# Patient Record
Sex: Male | Born: 1965 | Race: Black or African American | Hispanic: No | Smoking: Current every day smoker
Health system: Southern US, Community
[De-identification: ages and names within clinical notes are randomized; demographics above are authoritative.]

## PROBLEM LIST (undated history)

## (undated) DIAGNOSIS — IMO0001 Reserved for inherently not codable concepts without codable children: Secondary | ICD-10-CM

## (undated) DIAGNOSIS — K219 Gastro-esophageal reflux disease without esophagitis: Secondary | ICD-10-CM

## (undated) HISTORY — PX: HERNIA REPAIR: SHX51

## (undated) HISTORY — DX: Gastro-esophageal reflux disease without esophagitis: K21.9

---

## 2006-11-09 ENCOUNTER — Emergency Department (HOSPITAL_COMMUNITY): Admission: EM | Admit: 2006-11-09 | Discharge: 2006-11-09 | Payer: Self-pay | Admitting: Family Medicine

## 2011-08-12 ENCOUNTER — Emergency Department (HOSPITAL_COMMUNITY)
Admission: EM | Admit: 2011-08-12 | Discharge: 2011-08-12 | Disposition: A | Discharge status: Home / Self Care | Payer: Self-pay | Attending: Emergency Medicine | Admitting: Emergency Medicine

## 2011-08-12 ENCOUNTER — Encounter: Payer: Self-pay | Admitting: *Deleted

## 2011-08-12 ENCOUNTER — Emergency Department (HOSPITAL_COMMUNITY): Payer: Self-pay

## 2011-08-12 DIAGNOSIS — R0602 Shortness of breath: Secondary | ICD-10-CM | POA: Insufficient documentation

## 2011-08-12 DIAGNOSIS — R059 Cough, unspecified: Secondary | ICD-10-CM | POA: Insufficient documentation

## 2011-08-12 DIAGNOSIS — R05 Cough: Secondary | ICD-10-CM | POA: Insufficient documentation

## 2011-08-12 DIAGNOSIS — J45901 Unspecified asthma with (acute) exacerbation: Secondary | ICD-10-CM | POA: Insufficient documentation

## 2011-08-12 MED ORDER — PREDNISONE 20 MG PO TABS
60.0000 mg | ORAL_TABLET | Freq: Once | ORAL | Status: AC
  Administered 2011-08-12: 60 mg via ORAL
  Filled 2011-08-12: qty 3

## 2011-08-12 MED ORDER — ALBUTEROL SULFATE (5 MG/ML) 0.5% IN NEBU
5.0000 mg | INHALATION_SOLUTION | Freq: Once | RESPIRATORY_TRACT | Status: AC
  Administered 2011-08-12: 5 mg via RESPIRATORY_TRACT
  Filled 2011-08-12: qty 1

## 2011-08-12 MED ORDER — PREDNISONE 10 MG PO TABS: 20.0000 mg | ORAL_TABLET | Freq: Two times a day (BID) | ORAL | Status: AC

## 2011-08-12 MED ORDER — ALBUTEROL SULFATE (5 MG/ML) 0.5% IN NEBU
INHALATION_SOLUTION | RESPIRATORY_TRACT | Status: AC
  Filled 2011-08-12: qty 2

## 2011-08-12 MED ORDER — ALBUTEROL (5 MG/ML) CONTINUOUS INHALATION SOLN
10.0000 mg/h | INHALATION_SOLUTION | Freq: Once | RESPIRATORY_TRACT | Status: AC
  Administered 2011-08-12: 10 mg/h via RESPIRATORY_TRACT

## 2011-08-12 MED ORDER — AEROCHAMBER PLUS W/MASK MISC
Status: AC
  Administered 2011-08-12: 15:00:00
  Filled 2011-08-12: qty 1

## 2011-08-12 MED ORDER — IPRATROPIUM BROMIDE 0.02 % IN SOLN
0.5000 mg | Freq: Once | RESPIRATORY_TRACT | Status: AC
  Administered 2011-08-12: 0.5 mg via RESPIRATORY_TRACT
  Filled 2011-08-12: qty 2.5

## 2011-08-12 MED ORDER — ALBUTEROL SULFATE HFA 108 (90 BASE) MCG/ACT IN AERS
2.0000 | INHALATION_SPRAY | RESPIRATORY_TRACT | Status: AC
  Administered 2011-08-12: 2 via RESPIRATORY_TRACT
  Filled 2011-08-12: qty 6.7

## 2011-08-12 NOTE — ED Notes (Signed)
Pt eating at present and RT will send next shift RT down to give hr long neb tx after pt finishes eating.

## 2011-08-12 NOTE — ED Notes (Signed)
Pt NAD, resp e/u, pt states understanding of discharge instructions and denies questions at time of discharge. Pt ambulatory with steady gait, AOx4 at time of discharge.

## 2011-08-12 NOTE — Discharge Instructions (Signed)
Use albuterol inhaler, 2 puffs every 4 hours, as needed for cough and shortness of breath.  Take prednisone as prescribed.  Call [facility] [phone]) if you do not have a primary care doctor and would like assistance with finding one.   You should return to the ER if your shortness of breath worsens. Asthma Attack Prevention HOW CAN ASTHMA BE PREVENTED? Currently, there is no way to prevent asthma from starting. However, you can take steps to control the disease and prevent its symptoms after you have been diagnosed. Learn about your asthma and how to control it. Take an active role to control your asthma by working with your caregiver to create and follow an asthma action plan. An asthma action plan guides you in taking your medicines properly, avoiding factors that make your asthma worse, tracking your level of asthma control, responding to worsening asthma, and seeking emergency care when needed. To track your asthma, keep records of your symptoms, check your peak flow number using a peak flow meter (handheld device that shows how well air moves out of your lungs), and get regular asthma checkups.  Other ways to prevent asthma attacks include:  Use medicines as your caregiver directs.   Identify and avoid things that make your asthma worse (as much as you can).   Keep track of your asthma symptoms and level of control.   Get regular checkups for your asthma.   With your caregiver, write a detailed plan for taking medicines and managing an asthma attack. Then be sure to follow your action plan. Asthma is an ongoing condition that needs regular monitoring and treatment.   Identify and avoid asthma triggers. A number of outdoor allergens and irritants (pollen, mold, cold air, air pollution) can trigger asthma attacks. Find out what causes or makes your asthma worse, and take steps to avoid those triggers (see below).   Monitor your breathing. Learn to recognize warning signs of an attack, such  as slight coughing, wheezing or shortness of breath. However, your lung function may already decrease before you notice any signs or symptoms, so regularly measure and record your peak airflow with a home peak flow meter.   Identify and treat attacks early. If you act quickly, you're less likely to have a severe attack. You will also need less medicine to control your symptoms. When your peak flow measurements decrease and alert you to an upcoming attack, take your medicine as instructed, and immediately stop any activity that may have triggered the attack. If your symptoms do not improve, get medical help.   Pay attention to increasing quick-relief inhaler use. If you find yourself relying on your quick-relief inhaler (such as albuterol), your asthma is not under control. See your caregiver about adjusting your treatment.  IDENTIFY AND CONTROL FACTORS THAT MAKE YOUR ASTHMA WORSE A number of common things can set off or make your asthma symptoms worse (asthma triggers). Keep track of your asthma symptoms for several weeks, detailing all the environmental and emotional factors that are linked with your asthma. When you have an asthma attack, go back to your asthma diary to see which factor, or combination of factors, might have contributed to it. Once you know what these factors are, you can take steps to control many of them.  Allergies: If you have allergies and asthma, it is important to take asthma prevention steps at home. Asthma attacks (worsening of asthma symptoms) can be triggered by allergies, which can cause temporary increased inflammation of your airways.  Minimizing contact with the substance to which you are allergic will help prevent an asthma attack. Animal Dander:   Some people are allergic to the flakes of skin or dried saliva from animals with fur or feathers. Keep these pets out of your home.   If you can't keep a pet outdoors, keep the pet out of your bedroom and other sleeping areas  at all times, and keep the door closed.   Remove carpets and furniture covered with cloth from your home. If that is not possible, keep the pet away from fabric-covered furniture and carpets.  Dust Mites:  Many people with asthma are allergic to dust mites. Dust mites are tiny bugs that are found in every home, in mattresses, pillows, carpets, fabric-covered furniture, bedcovers, clothes, stuffed toys, fabric, and other fabric-covered items.   Cover your mattress in a special dust-proof cover.   Cover your pillow in a special dust-proof cover, or wash the pillow each week in hot water. Water must be hotter than 130 F to kill dust mites. Cold or warm water used with detergent and bleach can also be effective.   Wash the sheets and blankets on your bed each week in hot water.   Try not to sleep or lie on cloth-covered cushions.   Call ahead when traveling and ask for a smoke-free hotel room. Bring your own bedding and pillows, in case the hotel only supplies feather pillows and down comforters, which may contain dust mites and cause asthma symptoms.   Remove carpets from your bedroom and those laid on concrete, if you can.   Keep stuffed toys out of the bed, or wash the toys weekly in hot water or cooler water with detergent and bleach.  Cockroaches:  Many people with asthma are allergic to the droppings and remains of cockroaches.   Keep food and garbage in closed containers. Never leave food out.   Use poison baits, traps, powders, gels, or paste (for example, boric acid).   If a spray is used to kill cockroaches, stay out of the room until the odor goes away.  Indoor Mold:  Fix leaky faucets, pipes, or other sources of water that have mold around them.   Clean moldy surfaces with a cleaner that has bleach in it.  Pollen and Outdoor Mold:  When pollen or mold spore counts are high, try to keep your windows closed.   Stay indoors with windows closed from late morning to  afternoon, if you can. Pollen and some mold spore counts are highest at that time.   Ask your caregiver whether you need to take or increase anti-inflammatory medicine before your allergy season starts.  Irritants:   Tobacco smoke is an irritant. If you smoke, ask your caregiver how you can quit. Ask family members to quit smoking, too. Do not allow smoking in your home or car.   If possible, do not use a wood-burning stove, kerosene heater, or fireplace. Minimize exposure to all sources of smoke, including incense, candles, fires, and fireworks.   Try to stay away from strong odors and sprays, such as perfume, talcum powder, hair spray, and paints.   Decrease humidity in your home and use an indoor air cleaning device. Reduce indoor humidity to below 60 percent. Dehumidifiers or central air conditioners can do this.   Try to have someone else vacuum for you once or twice a week, if you can. Stay out of rooms while they are being vacuumed and for a short while afterward.  If you vacuum, use a dust mask from a hardware store, a double-layered or microfilter vacuum cleaner bag, or a vacuum cleaner with a HEPA filter.   Sulfites in foods and beverages can be irritants. Do not drink beer or wine, or eat dried fruit, processed potatoes, or shrimp if they cause asthma symptoms.   Cold air can trigger an asthma attack. Cover your nose and mouth with a scarf on cold or windy days.   Several health conditions can make asthma more difficult to manage, including runny nose, sinus infections, reflux disease, psychological stress, and sleep apnea. Your caregiver will treat these conditions, as well.   Avoid close contact with people who have a cold or the flu, since your asthma symptoms may get worse if you catch the infection from them. Wash your hands thoroughly after touching items that may have been handled by people with a respiratory infection.   Get a flu shot every year to protect against the  flu virus, which often makes asthma worse for days or weeks. Also get a pneumonia shot once every five to 10 years.  Drugs:  Aspirin and other painkillers can cause asthma attacks. 10% to 20% of people with asthma have sensitivity to aspirin or a group of painkillers called non-steroidal anti-inflammatory drugs (NSAIDS), such as ibuprofen and naproxen. These drugs are used to treat pain and reduce fevers. Asthma attacks caused by any of these medicines can be severe and even fatal. These drugs must be avoided in people who have known aspirin sensitive asthma. Products with acetaminophen are considered safe for people who have asthma. It is important that people with aspirin sensitivity read labels of all over-the-counter drugs used to treat pain, colds, coughs, and fever.   Beta blockers and ACE inhibitors are other drugs which you should discuss with your caregiver, in relation to your asthma.  ALLERGY SKIN TESTING  Ask your asthma caregiver about allergy skin testing or blood testing (RAST test) to identify the allergens to which you are sensitive. If you are found to have allergies, allergy shots (immunotherapy) for asthma may help prevent future allergies and asthma. With allergy shots, small doses of allergens (substances to which you are allergic) are injected under your skin on a regular schedule. Over a period of time, your body may become used to the allergen and less responsive with asthma symptoms. You can also take measures to minimize your exposure to those allergens. EXERCISE  If you have exercise-induced asthma, or are planning vigorous exercise, or exercise in cold, humid, or dry environments, prevent exercise-induced asthma by following your caregiver's advice regarding asthma treatment before exercising. Document Released: 08/23/2009 Document Revised: 05/17/2011 Document Reviewed: 08/23/2009 [facility] Patient Information 2012 [place], [place].

## 2011-08-12 NOTE — ED Provider Notes (Signed)
History     CSN: [id number] Arrival date & time: 08/12/2011  1:23 PM      Chief Complaint  Patient presents with  . Shortness of Breath     HPI Pt was seen at 1400.  Per pt, c/o gradual onset and worsening of persistent SOB, wheezing and cough that began yesterday.  Describes his symptoms as "my asthma attack."  States he has hx of asthma, ran out of his MDI "a while ago."  Cannot recall LD prednisone.  Denies CP/palpitations, no abd pain, no N/V/D, no fevers, no rash, no back pain.    Past Medical History  Diagnosis Date  . Asthma     History reviewed. No pertinent past surgical history.   History  Substance Use Topics  . Smoking status: Former Games developer  . Smokeless tobacco: Not on file  . Alcohol Use: No    Review of Systems ROS: Statement: All systems negative except as marked or noted in the HPI; Constitutional: Negative for fever and chills. ; ; Eyes: Negative for eye pain, redness and discharge. ; ; ENMT: Negative for ear pain, hoarseness, nasal congestion, sinus pressure and sore throat. ; ; Cardiovascular: Negative for chest pain, palpitations, diaphoresis, and peripheral edema. ; ; Respiratory: +cough, wheezing, SOB; negative for stridor. ; ; Gastrointestinal: Negative for nausea, vomiting, diarrhea and abdominal pain, blood in stool, hematemesis, jaundice and rectal bleeding. . ; ; Genitourinary: Negative for dysuria, flank pain and hematuria. ; ; Musculoskeletal: Negative for back pain and neck pain. Negative for swelling and trauma.; ; Skin: Negative for pruritus, rash, abrasions, blisters, bruising and skin lesion.; ; Neuro: Negative for headache, lightheadedness and neck stiffness. Negative for weakness, altered level of consciousness , altered mental status, extremity weakness, paresthesias, involuntary movement, seizure and syncope.     Allergies  Review of patient's allergies indicates no known allergies.  Home Medications   Current Outpatient Rx  Name Route  Sig Dispense Refill  . PRIMATENE ASTHMA PO Oral Take 1 tablet by mouth daily.        Pulse 96  Temp(Src) 98.1 F (36.7 C) (Oral)  Resp 14  SpO2 99%  Physical Exam 1405: Physical examination:  Nursing notes reviewed; Vital signs and O2 SAT reviewed;  Constitutional: Well developed, Well nourished, Well hydrated, In no acute distress; Head:  Normocephalic, atraumatic; Eyes: EOMI, PERRL, No scleral icterus; ENMT: Mouth and pharynx normal, Mucous membranes moist; Neck: Supple, Full range of motion, No lymphadenopathy; Cardiovascular: Regular rate and rhythm, No murmur, rub, or gallop; Respiratory: Breath sounds coarse & equal bilaterally, +insp and exp wheezes bilat, no audible wheezing.  Speaking full sentences with ease., No retrax or access mm use.  Normal respiratory effort/excursion; Chest: Nontender, Movement normal; Abdomen: Soft, Nontender, Nondistended, Normal bowel sounds; Genitourinary: No CVA tenderness; Extremities: Pulses normal, No tenderness, No edema, No calf edema or asymmetry.; Neuro: AA&Ox3, Major CN grossly intact.  No gross focal motor or sensory deficits in extremities.; Skin: Color normal, Warm, Dry, no rash.    ED Course  Procedures  1530:  States he is "starting to feel better now" after 2 neb tx and prednisone.  Lungs with exp wheezes bilat, speaking full sentences, NAD, Sats increased from 91% R/A to 99% R/A.  Will dose 3rd neb and re-eval.   MDM  MDM Reviewed: nursing note and vitals Interpretation: x-ray  CRITICAL CARE Performed by: Laray Anger Total critical care time: 35 Critical care time was exclusive of separately billable procedures and treating other patients. Critical  care was necessary to treat or prevent imminent or life-threatening deterioration. Critical care was time spent personally by me on the following activities: development of treatment plan with patient and/or surrogate as well as nursing, discussions with consultants, evaluation of  patient's response to treatment, examination of patient, obtaining history from patient or surrogate, ordering and performing treatments and interventions, ordering and review of laboratory studies, ordering and review of radiographic studies, pulse oximetry and re-evaluation of patient's condition.    Dg Chest 2 View  08/12/2011  *RADIOLOGY REPORT*  Clinical Data: 45 year old male with shortness of breath.  History of asthma.  CHEST - 2 VIEW  Comparison: None  Findings: The cardiomediastinal silhouette is unremarkable. Mild peribronchial thickening is identified. There is no evidence of focal airspace disease, pulmonary edema, pulmonary nodule/mass, pleural effusion, or pneumothorax. No acute bony abnormalities are identified.  IMPRESSION: Mild peribronchial thickening without focal pneumonia.  This is of uncertain chronicity but may represent bronchitis or changes from asthma.  Original Report Authenticated By: Rosendo Gros, M.D.     1700:  Pt states he feels "a lot better" after 3rd neb.  Lungs continue coarse with exp wheezes bilat.  +non-prod cough with exp wheezes.  No audible wheezing.  Sats 98% R/A, but drop to 91-93% R/A while talking to me.  No resp distress, no retrax or access mm use.  Speaking full sentences.  Dx testing d/w pt.  Questions answered.  Verb understanding, agreeable to CDU Obs wheezing protocol.  CDU PA Schinlever given report.       [name], DO 08/14/11 [phone]

## 2011-08-12 NOTE — ED Notes (Signed)
primatidine tablets for asthma - not helping. Non-productive cough. Upper airway congestion.

## 2011-08-12 NOTE — ED Provider Notes (Signed)
  Physical Exam  BP 121/75  Pulse 84  Temp(Src) 98.1 F (36.7 C) (Oral)  Resp 14  SpO2 95%  Physical Exam  ED Course  Procedures  MDM Pt moved to CDU on asthma protocol.  He has received an hour long continuous neb and reports that SOB and cough have improved.  O2 sat ranging from 91-97% on rm air.  Continues to have diffuse wheezing but nml effort and equal insp/exp phases.  He would like to stay a little bit longer.  Will reassess in 1 hour.  8:21 PM   O2 sat 94-96% currently.  Rest of VSS.  Pt feels well enough to go home.  Prescribed 5 day course of prednisone.  [facility]erred to [facility].  Return precautions discussed.   Otilio Miu, [place] 08/12/11 2118

## 2011-08-12 NOTE — ED Notes (Signed)
Pt given regular diet tray for dinner.

## 2011-08-14 NOTE — ED Provider Notes (Signed)
Medical screening examination/treatment/procedure(s) were performed by non-physician practitioner and as supervising physician I was immediately available for consultation/collaboration.   Santiana Glidden M Annalisse Minkoff, DO 08/14/11 0058 

## 2011-12-05 ENCOUNTER — Emergency Department (HOSPITAL_COMMUNITY)
Admission: EM | Admit: 2011-12-05 | Discharge: 2011-12-06 | Disposition: A | Discharge status: Home / Self Care | Payer: Self-pay | Attending: Emergency Medicine | Admitting: Emergency Medicine

## 2011-12-05 DIAGNOSIS — J45909 Unspecified asthma, uncomplicated: Secondary | ICD-10-CM | POA: Insufficient documentation

## 2011-12-05 DIAGNOSIS — Z87891 Personal history of nicotine dependence: Secondary | ICD-10-CM | POA: Insufficient documentation

## 2011-12-05 DIAGNOSIS — S139XXA Sprain of joints and ligaments of unspecified parts of neck, initial encounter: Secondary | ICD-10-CM | POA: Insufficient documentation

## 2011-12-05 DIAGNOSIS — S0990XA Unspecified injury of head, initial encounter: Secondary | ICD-10-CM | POA: Insufficient documentation

## 2011-12-05 DIAGNOSIS — S161XXA Strain of muscle, fascia and tendon at neck level, initial encounter: Secondary | ICD-10-CM

## 2011-12-05 DIAGNOSIS — S1093XA Contusion of unspecified part of neck, initial encounter: Secondary | ICD-10-CM | POA: Insufficient documentation

## 2011-12-05 DIAGNOSIS — S0003XA Contusion of scalp, initial encounter: Secondary | ICD-10-CM | POA: Insufficient documentation

## 2011-12-06 ENCOUNTER — Emergency Department (HOSPITAL_COMMUNITY): Payer: Self-pay

## 2011-12-06 ENCOUNTER — Encounter (HOSPITAL_COMMUNITY): Payer: Self-pay | Admitting: Radiology

## 2011-12-06 MED ORDER — HYDROCODONE-ACETAMINOPHEN 5-500 MG PO TABS
1.0000 | ORAL_TABLET | Freq: Four times a day (QID) | ORAL | Status: AC | PRN
Start: 2011-12-06 — End: 2011-12-16

## 2011-12-06 NOTE — ED Notes (Signed)
Pt rolled off LSB by Dr Judd Lien, TLS cleared; c-collar remains
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
## 2011-12-06 NOTE — Discharge Instructions (Signed)
Concussion and Brain Injury A blow or jolt to the head can disrupt the normal function of the brain. This type of brain injury is often called a "concussion" or a "closed head injury." Concussions are usually not life-threatening. Even so, the effects of a concussion can be serious.  CAUSES  A concussion is caused by a blunt blow to the head. The blow might be direct or indirect as described below.  Direct blow (running into another player during a soccer game, being hit in a fight, or hitting your head on a hard surface).   Indirect blow (when your head moves rapidly and violently back and forth like in a car crash).  SYMPTOMS  The brain is very complex. Every head injury is different. Some symptoms may appear right away. Other symptoms may not show up for days or weeks after the concussion. The signs of concussion can be hard to notice. Early on, problems may be missed by patients, family members, and caregivers. You may look fine even though you are acting or feeling differently.  These symptoms are usually temporary, but may last for days, weeks, or even longer. Symptoms include:  Mild headaches that will not go away.   Having more trouble than usual with:   Remembering things.   Paying attention or concentrating.   Organizing daily tasks.   Making decisions and solving problems.   Slowness in thinking, acting, speaking, or reading.   Getting lost or easily confused.   Feeling tired all the time or lacking energy (fatigue).   Feeling drowsy.   Sleep disturbances.   Sleeping more than usual.   Sleeping less than usual.   Trouble falling asleep.   Trouble sleeping (insomnia).   Loss of balance or feeling lightheaded or dizzy.   Nausea or vomiting.   Numbness or tingling.   Increased sensitivity to:   Sounds.   Lights.   Distractions.  Other symptoms might include:  Vision problems or eyes that tire easily.   Diminished sense of taste or smell.   Ringing  in the ears.   Mood changes such as feeling sad, anxious, or listless.   Becoming easily irritated or angry for little or no reason.   Lack of motivation.  DIAGNOSIS  Your caregiver can usually diagnose a concussion or mild brain injury based on your description of your injury and your symptoms.  Your evaluation might include:  A brain scan to look for signs of injury to the brain. Even if the test shows no injury, you may still have a concussion.   Blood tests to be sure other problems are not present.  TREATMENT   People with a concussion need to be examined and evaluated. Most people with concussions are treated in an emergency department, urgent care, or clinic. Some people must stay in the hospital overnight for further treatment.   Your caregiver will send you home with important instructions to follow. Be sure to carefully follow them.   Tell your caregiver if you are already taking any medicines (prescription, over-the-counter, or natural remedies), or if you are drinking alcohol or taking illegal drugs. Also, talk with your caregiver if you are taking blood thinners (anticoagulants) or aspirin. These drugs may increase your chances of complications. All of this is important information that may affect treatment.   Only take over-the-counter or prescription medicines for pain, discomfort, or fever as directed by your caregiver.  PROGNOSIS  How fast people recover from brain injury varies from person to person.   Although most people have a good recovery, how quickly they improve depends on many factors. These factors include how severe their concussion was, what part of the brain was injured, their age, and how healthy they were before the concussion.  Because all head injuries are different, so is recovery. Most people with mild injuries recover fully. Recovery can take time. In general, recovery is slower in older persons. Also, persons who have had a concussion in the past or have  other medical problems may find that it takes longer to recover from their current injury. Anxiety and depression may also make it harder to adjust to the symptoms of brain injury. HOME CARE INSTRUCTIONS  Return to your normal activities slowly, not all at once. You must give your body and brain enough time for recovery.  Get plenty of sleep at night, and rest during the day. Rest helps the brain to heal.   Avoid staying up late at night.   Keep the same bedtime hours on weekends and weekdays.   Take daytime naps or rest breaks when you feel tired.   Limit activities that require a lot of thought or concentration (brain or cognitive rest). This includes:   Homework or job-related work.   Watching TV.   Computer work.   Avoid activities that could lead to a second brain injury, such as contact or recreational sports, until your caregiver says it is okay. Even after your brain injury has healed, you should protect yourself from having another concussion.   Ask your caregiver when you can return to your normal activities such as driving, bicycling, or operating heavy equipment. Your ability to react may be slower after a brain injury.   Talk with your caregiver about when you can return to work or school.   Inform your teachers, school nurse, school counselor, coach, athletic trainer, or work manager about your injury, symptoms, and restrictions. They should be instructed to report:   Increased problems with attention or concentration.   Increased problems remembering or learning new information.   Increased time needed to complete tasks or assignments.   Increased irritability or decreased ability to cope with stress.   Increased symptoms.   Take only those medicines that your caregiver has approved.   Do not drink alcohol until your caregiver says you are well enough to do so. Alcohol and certain other drugs may slow your recovery and can put you at risk of further injury.    If it is harder than usual to remember things, write them down.   If you are easily distracted, try to do one thing at a time. For example, do not try to watch TV while fixing dinner.   Talk with family members or close friends when making important decisions.   Keep all follow-up appointments. Repeated evaluation of your symptoms is recommended for your recovery.  PREVENTION  Protect your head from future injury. It is very important to avoid another head or brain injury before you have recovered. In rare cases, another injury has lead to permanent brain damage, brain swelling, or death. Avoid injuries by using:  Seatbelts when riding in a car.   Alcohol only in moderation.   A helmet when biking, skiing, skateboarding, skating, or doing similar activities.   Safety measures in your home.   Remove clutter and tripping hazards from floors and stairways.   Use grab bars in bathrooms and handrails by stairs.   Place non-slip mats on floors and in bathtubs.     Improve lighting in dim areas.  SEEK MEDICAL CARE IF:  A head injury can cause lingering symptoms. You should seek medical care if you have any of the following symptoms for more than 3 weeks after your injury or are planning to return to sports:  Chronic headaches.   Dizziness or balance problems.   Nausea.   Vision problems.   Increased sensitivity to noise or light.   Depression or mood swings.   Anxiety or irritability.   Memory problems.   Difficulty concentrating or paying attention.   Sleep problems.   Feeling tired all the time.  SEEK IMMEDIATE MEDICAL CARE IF:  You have had a blow or jolt to the head and you (or your family or friends) notice:  Severe or worsening headaches.   Weakness (even if only in one hand or one leg or one part of the face), numbness, or decreased coordination.   Repeated vomiting.   Increased sleepiness or passing out.   One black center of the eye (pupil) is larger  than the other.   Convulsions (seizures).   Slurred speech.   Increasing confusion, restlessness, agitation, or irritability.   Lack of ability to recognize people or places.   Neck pain.   Difficulty being awakened.   Unusual behavior changes.   Loss of consciousness.  Older adults with a brain injury may have a higher risk of serious complications such as a blood clot on the brain. Headaches that get worse or an increase in confusion are signs of this complication. If these signs occur, see a caregiver right away. MAKE SURE YOU:   Understand these instructions.   Will watch your condition.   Will get help right away if you are not doing well or get worse.  FOR MORE INFORMATION  Several groups help people with brain injury and their families. They provide information and put people in touch with local resources. These include support groups, rehabilitation services, and a variety of health care professionals. Among these groups, the Brain Injury Association (BIA, www.biausa.org) has a national office that gathers scientific and educational information and works on a national level to help people with brain injury.  Document Released: 11/25/2003 Document Revised: 08/24/2011 Document Reviewed: 04/22/2008 Exit[employer] Patient Information 2012 [employer]. 
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
## 2013-11-21 ENCOUNTER — Emergency Department (HOSPITAL_COMMUNITY)
Admission: EM | Admit: 2013-11-21 | Discharge: 2013-11-22 | Disposition: A | Discharge status: Home / Self Care | Payer: Self-pay | Attending: Emergency Medicine | Admitting: Emergency Medicine

## 2013-11-21 ENCOUNTER — Encounter (HOSPITAL_COMMUNITY): Payer: Self-pay | Admitting: Emergency Medicine

## 2013-11-21 DIAGNOSIS — Z87891 Personal history of nicotine dependence: Secondary | ICD-10-CM | POA: Insufficient documentation

## 2013-11-21 DIAGNOSIS — J45901 Unspecified asthma with (acute) exacerbation: Secondary | ICD-10-CM | POA: Insufficient documentation

## 2013-11-21 DIAGNOSIS — R000 Tachycardia, unspecified: Secondary | ICD-10-CM | POA: Insufficient documentation

## 2013-11-21 DIAGNOSIS — Z760 Encounter for issue of repeat prescription: Secondary | ICD-10-CM | POA: Insufficient documentation

## 2013-11-21 MED ORDER — DEXAMETHASONE SODIUM PHOSPHATE 10 MG/ML IJ SOLN
10.0000 mg | Freq: Once | INTRAMUSCULAR | Status: AC
  Administered 2013-11-21: 10 mg via INTRAMUSCULAR
  Filled 2013-11-21: qty 1

## 2013-11-21 MED ORDER — IPRATROPIUM BROMIDE 0.02 % IN SOLN
0.5000 mg | Freq: Once | RESPIRATORY_TRACT | Status: AC
  Administered 2013-11-21: 0.5 mg via RESPIRATORY_TRACT
  Filled 2013-11-21: qty 2.5

## 2013-11-21 MED ORDER — IPRATROPIUM-ALBUTEROL 0.5-2.5 (3) MG/3ML IN SOLN
3.0000 mL | Freq: Once | RESPIRATORY_TRACT | Status: AC
  Administered 2013-11-21: 3 mL via RESPIRATORY_TRACT
  Filled 2013-11-21: qty 3

## 2013-11-21 MED ORDER — ALBUTEROL SULFATE (2.5 MG/3ML) 0.083% IN NEBU
5.0000 mg | INHALATION_SOLUTION | Freq: Once | RESPIRATORY_TRACT | Status: AC
  Administered 2013-11-21: 5 mg via RESPIRATORY_TRACT
  Filled 2013-11-21: qty 6

## 2013-11-21 NOTE — ED Notes (Signed)
The pt has audible wheezes

## 2013-11-21 NOTE — ED Notes (Signed)
Respiratory notified for breathing Tx.

## 2013-11-21 NOTE — ED Provider Notes (Signed)
CSN: [id number]     Arrival date & time 11/21/13  2121 History   First MD Initiated Contact with Patient 11/21/13 2313     Chief Complaint  Patient presents with  . Shortness of Breath     (Consider location/radiation/quality/duration/timing/severity/associated sxs/prior Treatment) HPI Pt is a 48yo male with hx of asthma c/o SOB that started yesterday, worse today. Pt reports being out of his albuterol for 7 days.  States it feels difficult to breath like previous asthma attacks. Also reports intermittent mild dry cough. Denies chest pain, fever, n/v/d. Denies recent cold or sick contacts. Denies any other symptoms or complaints at this time.  Pt is a smoker.   Past Medical History  Diagnosis Date  . Asthma    History reviewed. No pertinent past surgical history. No family history on file. History  Substance Use Topics  . Smoking status: Former Games developer  . Smokeless tobacco: Not on file  . Alcohol Use: No    Review of Systems  Constitutional: Negative for fever and chills.  HENT: Negative for congestion.   Respiratory: Positive for shortness of breath. Negative for cough.   Cardiovascular: Negative for chest pain.  Gastrointestinal: Negative for nausea, vomiting, abdominal pain and diarrhea.  All other systems reviewed and are negative.      Allergies  Shrimp  Home Medications  No current outpatient prescriptions on file. BP 141/91  Pulse 111  Temp(Src) 98.1 F (36.7 C)  Resp 28  Ht 5\' 1"  (1.549 m)  Wt 165 lb (74.844 kg)  BMI 31.19 kg/m2  SpO2 94% Physical Exam  Nursing note and vitals reviewed. Constitutional: He appears well-developed and well-nourished.  Pt appears well, NAD.   HENT:  Head: Normocephalic and atraumatic.  Right Ear: Hearing, tympanic membrane, external ear and ear canal normal.  Left Ear: Hearing, tympanic membrane, external ear and ear canal normal.  Nose: Nose normal.  Mouth/Throat: Uvula is midline, oropharynx is clear and moist and  mucous membranes are normal.  Eyes: Conjunctivae are normal. No scleral icterus.  Neck: Normal range of motion. Neck supple.  Cardiovascular: Regular rhythm and normal heart sounds.  Tachycardia present.   Pulmonary/Chest: Effort normal. No respiratory distress. He has wheezes. He has no rales. He exhibits no tenderness.  No respiratory distress, able to speak in full sentences w/o difficulty. Lungs: diffuse inspiratory and expiratory wheeze.  No rales or rhonchi.   Abdominal: Soft. Bowel sounds are normal. He exhibits no distension and no mass. There is no tenderness. There is no rebound and no guarding.  Musculoskeletal: Normal range of motion.  Neurological: He is alert.  Skin: Skin is warm and dry.    ED Course  Procedures (including critical care time) Labs Review Labs Reviewed - No data to display Imaging Review No results found.   EKG Interpretation None      MDM   Final diagnoses:  Asthma exacerbation  Medication refill    Pt with hx of asthma presenting with asthma exacerbation after being off albuterol inhaler for 7 days. Pt is a smoker.  Denies chest pain or recent illness. Denies fever, n/v/d. Pt is tachycardic and tachypneac in triage, 94% on room air. Pt stated he felt moderate improvement after 1 duoneb tx.  In exam room, no respiratory distress, however still diffuse inspiratory and expiratory wheezing.  Will give 2nd duoneb.  12:36 AM pt still has diffuse wheezes, however pt states he feels significantly better and feels comfortable being discharged home.    Albuterol inhaler  given in ED. Advised to f/u with PCP, resource guide provided. Return precautions provided. Pt verbalized understanding and agreement with tx plan.        Junius FinnerErin O'Malley, PA-C 11/22/13 0045
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
## 2013-11-22 MED ORDER — ALBUTEROL SULFATE HFA 108 (90 BASE) MCG/ACT IN AERS
2.0000 | INHALATION_SPRAY | RESPIRATORY_TRACT | Status: DC | PRN
  Administered 2013-11-22: 2 via RESPIRATORY_TRACT
  Filled 2013-11-22: qty 6.7

## 2013-11-22 NOTE — Discharge Instructions (Signed)
Be sure to follow up with primary care for ongoing healthcare needs including asthma and medication refills. Refer to resource guide below.    Asthma, Adult Asthma is a condition of the lungs in which the airways tighten and narrow. Asthma can make it hard to breathe. Asthma cannot be cured, but medicine and lifestyle changes can help control it. Asthma may be started (triggered) by:  Animal skin flakes (dander).  Dust.  Cockroaches.  Pollen.  Mold.  Smoke.  Cleaning products.  Hair sprays or aerosol sprays.  Paint fumes or strong smells.  Cold air, weather changes, and winds.  Crying or laughing hard.  Stress.  Certain medicines or drugs.  Foods, such as dried fruit, potato chips, and sparkling grape juice.  Infections or conditions (colds, flu).  Exercise.  Certain medical conditions or diseases.  Exercise or tiring activities. HOME CARE   Take medicine as told by your doctor.  Use a peak flow meter as told by your doctor. A peak flow meter is a tool that measures how well the lungs are working.  Record and keep track of the peak flow meter's readings.  Understand and use the asthma action plan. An asthma action plan is a written plan for taking care of your asthma and treating your attacks.  To help prevent asthma attacks:  Do not smoke. Stay away from secondhand smoke.  Change your heating and air conditioning filter often.  Limit your use of fireplaces and wood stoves.  Get rid of pests (such as roaches and mice) and their droppings.  Throw away plants if you see mold on them.  Clean your floors. Dust regularly. Use cleaning products that do not smell.  Have someone vacuum when you are not home. Use a vacuum cleaner with a HEPA filter if possible.  Replace carpet with wood, tile, or vinyl flooring. Carpet can trap animal skin flakes and dust.  Use allergy-proof pillows, mattress covers, and box spring covers.  Wash bed sheets and blankets  every week in hot water and dry them in a dryer.  Use blankets that are made of polyester or cotton.  Clean bathrooms and kitchens with bleach. If possible, have someone repaint the walls in these rooms with mold-resistant paint. Keep out of the rooms that are being cleaned and painted.  Wash hands often. GET HELP IF:  You have make a whistling sound when breaking (wheeze), have shortness of breath, or have a cough even if taking medicine to prevent attacks.  The colored mucus you cough up (sputum) is thicker than usual.  The colored mucus you cough up changes from clear or white to yellow, green, gray, or bloody.  You have problems from the medicine you are taking such as:  A rash.  Itching.  Swelling.  Trouble breathing.  You need reliever medicines more than 2 3 times a week.  Your peak flow measurement is still at 50 79% of your personal best after following the action plan for 1 hour. GET HELP RIGHT AWAY IF:   You seem to be worse and are not responding to medicine during an asthma attack.  You are short of breath even at rest.  You get short of breath when doing very little activity.  You have trouble eating, drinking, or talking.  You have chest pain.  You have a fast heartbeat.  Your lips or fingernails start to turn blue.  You are lightheaded, dizzy, or faint.  Your peak flow is less than 50% of your  personal best.  You have a fever or lasting symptoms for more than 2 3 days.  You have a fever and your symptoms suddenly get worse. MAKE SURE YOU:   Understand these instructions.  Will watch your condition.  Will get help right away if you are not doing well or get worse. Document Released: 02/21/2008 Document Revised: 06/25/2013 Document Reviewed: 04/03/2013 [facility] Patient Information 2014 [place], [place].   Emergency Department Resource Guide 1) Find a Doctor and Pay Out of Pocket Although you won't have to find out who is covered by your  insurance plan, it is a good idea to ask around and get recommendations. You will then need to call the office and see if the doctor you have chosen will accept you as a new patient and what types of options they offer for patients who are self-pay. Some doctors offer discounts or will set up payment plans for their patients who do not have insurance, but you will need to ask so you aren't surprised when you get to your appointment.  2) Contact Your Local Health Department Not all health departments have doctors that can see patients for sick visits, but many do, so it is worth a call to see if yours does. If you don't know where your local health department is, you can check in your phone book. The CDC also has a tool to help you locate your state's health department, and many state websites also have listings of all of their local health departments.  3) Find a Walk-in Clinic If your illness is not likely to be very severe or complicated, you may want to try a walk in clinic. These are popping up all over the country in pharmacies, drugstores, and shopping centers. They're usually staffed by nurse practitioners or physician assistants that have been trained to treat common illnesses and complaints. They're usually fairly quick and inexpensive. However, if you have serious medical issues or chronic medical problems, these are probably not your best option.  No Primary Care Doctor: - Call Health Connect at  [phone] - they can help you locate a primary care doctor that  accepts your insurance, provides certain services, etc. - Physician Referral Service- [phone]  Chronic Pain Problems: Organization         Address  Phone   Notes  [facility]  [phone] Patients need to be referred by their primary care doctor.   Medication Assistance: Organization         Address  Phone   Notes  [facility] Medication [facility] [address] [place], [place] [address] [phone] --Must be a resident of [facility] -- Must have NO insurance coverage whatsoever (no Medicaid/ Medicare, etc.) -- The pt. MUST have a primary care doctor that directs their care regularly and follows them in the community   [facility]  [phone]   [employer]  [phone]    Agencies that provide inexpensive medical care: Buyer, retail  Notes  [facility]  [phone]   [facility]    [phone]   [facility] [address] [place], [place] [address] [phone]   [facility] [address], [place] [phone]   [facility]    [phone]   [facility]    [phone]   [facility] and [facility]  [address], [place] Phone:  [phone], Fax:  [phone] Hours of Operation:  9 am - 6 pm, M-F.  Also accepts Medicaid/Medicare and self-pay.  [facility]  [address], [place] Phone: [phone], Fax: [phone]. Hours of Operation:  8:30 am - 5:30 pm, M-F.  Also accepts Medicaid and self-pay.  [facility] [place] [address], [place] Phone: [phone]   [facility] [address] Natasha Bence [place], [place] [phone], Ext. [phone] Mondays & Thursdays: 7-9 AM.  First 15 patients are seen on a first come, first serve basis.    Medicaid-accepting [facility] Providers:  Organization         Address                                                                       Phone                               Notes  [facility] [address], [place] [phone] Also accepts self-pay patients.  [facility] [address] Laurell Joseph[place],  [place]  [phone]   [facility] [address], [place] [phone]   [facility] [address], [place] [phone]   Renaye Rakers [address], [place]   [phone] Only accepts [place] patients after they have their name applied to their card.   Self-Pay (no insurance) in [facility]:   Organization         Address                                                     Phone               Notes  Sickle Cell Patients, [facility] [address] [place], [place] [phone]   [facility] [address] [place], [place] [phone]   [facility] [place]  [address], [place] [phone]   [facility]/Dr. Osei-Bonsu  [address] [place] or [address], [place] [phone] Phone number for both [place] and [place] locations is the same.  [facility]gent [facility] and [facility] [address], [place] [phone]   [facility] [address] [place] or [address] [phone]   [facility] [address], [place] [phone], phone; [phone], fax Sees patients 1st and 3rd Saturday of every month.  Must not qualify for public or private insurance (i.e. Medicaid, Medicare, [place] Health Choice, Veterans' Benefits)  Household income should be no more than 200% of the poverty level The clinic cannot treat you if you are pregnant or think you are pregnant  Sexually transmitted diseases are not treated at the clinic.

## 2013-11-23 NOTE — ED Provider Notes (Signed)
Medical screening examination/treatment/procedure(s) were performed by non-physician practitioner and as supervising physician I was immediately available for consultation/collaboration.   Caylor Tallarico, MD 11/23/13 0749 

## 2014-10-27 ENCOUNTER — Emergency Department (HOSPITAL_COMMUNITY)
Admission: EM | Admit: 2014-10-27 | Discharge: 2014-10-27 | Disposition: A | Discharge status: Home / Self Care | Payer: Self-pay | Attending: Emergency Medicine | Admitting: Emergency Medicine

## 2014-10-27 ENCOUNTER — Emergency Department (HOSPITAL_COMMUNITY): Payer: Self-pay

## 2014-10-27 ENCOUNTER — Encounter (HOSPITAL_COMMUNITY): Payer: Self-pay

## 2014-10-27 DIAGNOSIS — J219 Acute bronchiolitis, unspecified: Secondary | ICD-10-CM | POA: Insufficient documentation

## 2014-10-27 DIAGNOSIS — J45901 Unspecified asthma with (acute) exacerbation: Secondary | ICD-10-CM | POA: Insufficient documentation

## 2014-10-27 DIAGNOSIS — Z720 Tobacco use: Secondary | ICD-10-CM

## 2014-10-27 DIAGNOSIS — Z87891 Personal history of nicotine dependence: Secondary | ICD-10-CM | POA: Insufficient documentation

## 2014-10-27 LAB — CBC
HCT: 43.5 % (ref 39.0–52.0)
Hemoglobin: 14.7 g/dL (ref 13.0–17.0)
MCH: 34 pg (ref 26.0–34.0)
MCHC: 33.8 g/dL (ref 30.0–36.0)
MCV: 100.7 fL — ABNORMAL HIGH (ref 78.0–100.0)
Platelets: 318 10*3/uL (ref 150–400)
RBC: 4.32 MIL/uL (ref 4.22–5.81)
RDW: 12.7 % (ref 11.5–15.5)
WBC: 6 10*3/uL (ref 4.0–10.5)

## 2014-10-27 LAB — BASIC METABOLIC PANEL
ANION GAP: 9 (ref 5–15)
BUN: <5 mg/dL — ABNORMAL LOW (ref 6–23)
CHLORIDE: 104 mmol/L (ref 96–112)
CO2: 26 mmol/L (ref 19–32)
CREATININE: 0.84 mg/dL (ref 0.50–1.35)
Calcium: 9.2 mg/dL (ref 8.4–10.5)
Glucose, Bld: 106 mg/dL — ABNORMAL HIGH (ref 70–99)
Potassium: 4 mmol/L (ref 3.5–5.1)
Sodium: 139 mmol/L (ref 135–145)

## 2014-10-27 LAB — I-STAT TROPONIN, ED: Troponin i, poc: 0 ng/mL (ref 0.00–0.08)

## 2014-10-27 LAB — BRAIN NATRIURETIC PEPTIDE: B Natriuretic Peptide: 40.3 pg/mL (ref 0.0–100.0)

## 2014-10-27 MED ORDER — ALBUTEROL SULFATE HFA 108 (90 BASE) MCG/ACT IN AERS: 2.0000 | INHALATION_SPRAY | Freq: Four times a day (QID) | RESPIRATORY_TRACT | Status: DC | PRN

## 2014-10-27 MED ORDER — ALBUTEROL SULFATE HFA 108 (90 BASE) MCG/ACT IN AERS
2.0000 | INHALATION_SPRAY | RESPIRATORY_TRACT | Status: DC | PRN
  Administered 2014-10-27: 2 via RESPIRATORY_TRACT
  Filled 2014-10-27: qty 6.7

## 2014-10-27 MED ORDER — PREDNISONE 20 MG PO TABS: 20.0000 mg | ORAL_TABLET | Freq: Two times a day (BID) | ORAL | Status: DC

## 2014-10-27 MED ORDER — AEROCHAMBER Z-STAT PLUS/MEDIUM MISC
1.0000 | Freq: Once | Status: AC
Start: 2014-10-27 — End: 2014-10-27
  Administered 2014-10-27: 1
  Filled 2014-10-27: qty 1

## 2014-10-27 MED ORDER — PREDNISONE 20 MG PO TABS
60.0000 mg | ORAL_TABLET | Freq: Once | ORAL | Status: AC
Start: 2014-10-27 — End: 2014-10-27
  Administered 2014-10-27: 60 mg via ORAL
  Filled 2014-10-27: qty 3

## 2014-10-27 NOTE — ED Provider Notes (Addendum)
CSN: [id number]     Arrival date & time 10/27/14  1239 History   First MD Initiated Contact with Patient 10/27/14 1633     Chief Complaint  Patient presents with  . Chest Pain     (Consider location/radiation/quality/duration/timing/severity/associated sxs/prior Treatment) HPI   Adam Price is a 49 y.o. male who presents for evaluation of cough occasionally productive of sputum, shortness of breath with exertion, and chest tightness.  He denies fever, chills, nausea, vomiting, weakness or dizziness.  He has chronic recurrent bronchitis, but no history of coronary artery disease.  There are no other known modifying factors   Past Medical History  Diagnosis Date  . Asthma    History reviewed. No pertinent past surgical history. No family history on file. History  Substance Use Topics  . Smoking status: Former Games developermoker  . Smokeless tobacco: Not on file  . Alcohol Use: No    Review of Systems  All other systems reviewed and are negative.     Allergies  Shrimp  Home Medications   Prior to Admission medications   Medication Sig Start Date End Date Taking? Authorizing Provider  albuterol (PROVENTIL HFA;VENTOLIN HFA) 108 (90 BASE) MCG/ACT inhaler Inhale 2 puffs into the lungs every 6 (six) hours as needed for wheezing or shortness of breath. 10/27/14   Flint MelterElliott L Early Ord, MD  predniSONE (DELTASONE) 20 MG tablet Take 1 tablet (20 mg total) by mouth 2 (two) times daily. 10/27/14   Flint MelterElliott L Candyce Gambino, MD   BP 142/97 mmHg  Pulse 85  Temp(Src) 98.3 F (36.8 C) (Oral)  Resp 21  Ht 5\' 3"  (1.6 m)  Wt 165 lb (74.844 kg)  BMI 29.24 kg/m2  SpO2 91% Physical Exam  Constitutional: He is oriented to person, place, and time. He appears well-developed and well-nourished.  HENT:  Head: Normocephalic and atraumatic.  Right Ear: External ear normal.  Left Ear: External ear normal.  Eyes: Conjunctivae and EOM are normal. Pupils are equal, round, and reactive to light.  Neck: Normal range of  motion and phonation normal. Neck supple.  Cardiovascular: Normal rate, regular rhythm and normal heart sounds.   Pulmonary/Chest: Effort normal. He exhibits no bony tenderness.  Decrease her movement, bilaterally, with scattered rhonchi.  There are no rales.  There is no increased work of breathing.  Abdominal: Soft. There is no tenderness.  Musculoskeletal: Normal range of motion.  Neurological: He is alert and oriented to person, place, and time. No cranial nerve deficit or sensory deficit. He exhibits normal muscle tone. Coordination normal.  Skin: Skin is warm, dry and intact.  Psychiatric: He has a normal mood and affect. His behavior is normal. Judgment and thought content normal.  Nursing note and vitals reviewed.   ED Course  Procedures (including critical care time)  Medications  predniSONE (DELTASONE) tablet 60 mg (not administered)  albuterol (PROVENTIL HFA;VENTOLIN HFA) 108 (90 BASE) MCG/ACT inhaler 2 puff (not administered)  aerochamber Z-Stat Plus/medium 1 each (not administered)    Patient Vitals for the past 24 hrs:  BP Temp Temp src Pulse Resp SpO2 Height Weight  10/27/14 1648 142/97 mmHg 98.3 F (36.8 C) Oral 85 21 91 % - -  10/27/14 1246 109/80 mmHg 98.2 F (36.8 C) Oral 92 24 94 % 5\' 3"  (1.6 m) 165 lb (74.844 kg)        Labs Review Labs Reviewed  CBC - Abnormal; Notable for the following:    MCV 100.7 (*)    All other components within  normal limits  BASIC METABOLIC PANEL - Abnormal; Notable for the following:    Glucose, Bld 106 (*)    BUN <5 (*)    All other components within normal limits  BRAIN NATRIURETIC PEPTIDE  I-STAT TROPOININ, ED    Imaging Review Dg Chest 2 View  10/27/2014   CLINICAL DATA:  Chest tightness and pain.  EXAM: CHEST  2 VIEW  COMPARISON:  08/12/2011  FINDINGS: The heart size and mediastinal contours are within normal limits. Both lungs are clear. Fairly severe bilateral glenohumeral joint arthritis. No acute osseous  abnormality.  IMPRESSION: No active cardiopulmonary disease.   Electronically Signed   By: Francene Boyers M.D.   On: 10/27/2014 14:02     EKG Interpretation   Date/Time:  Tuesday October 27 2014 12:42:05 EST Ventricular Rate:  92 PR Interval:  158 QRS Duration: 78 QT Interval:  354 QTC Calculation: 437 R Axis:   75 Text Interpretation:  Normal sinus rhythm Normal ECG No old tracing to  compare Confirmed by [facility]  MD, Nadie Fiumara [phone]) on 10/27/2014 5:23:49 PM      MDM   Final diagnoses:  Acute bronchiolitis due to unspecified organism  Tobacco abuse    Bronchitis related to tobacco abuse.  He states that he stopped smoking about 3 weeks ago.  He is committed to avoiding tobacco products.  No evidence for pneumonia, ACS or PE.   Nursing Notes Reviewed/ Care Coordinated Applicable Imaging Reviewed Interpretation of Laboratory Data incorporated into ED treatment  The patient appears reasonably screened and/or stabilized for discharge and I doubt any other medical condition or other [facility] requiring further screening, evaluation, or treatment in the ED at this time prior to discharge.  Plan: Home Medications- Prednisone, Albuterol; Home Treatments- rest, avoid tobacco; return here if the recommended treatment, does not improve the symptoms; Recommended follow up- PCP for ongoing care 1-2 weeks       Flint Melter, MD 10/27/14 [phone]

## 2014-10-27 NOTE — ED Notes (Signed)
Pt reports 6/10 central chest tightness with SOB. States SOB worse when laying flat and with ambulation. Pt also reports productive cough x 2 days. Denies fever/chills. NAD.

## 2014-10-27 NOTE — Discharge Instructions (Signed)
Bronchospasm A bronchospasm is a spasm or tightening of the airways going into the lungs. During a bronchospasm breathing becomes more difficult because the airways get smaller. When this happens there can be coughing, a whistling sound when breathing (wheezing), and difficulty breathing. Bronchospasm is often associated with asthma, but not all patients who experience a bronchospasm have asthma. CAUSES  A bronchospasm is caused by inflammation or irritation of the airways. The inflammation or irritation may be triggered by:   Allergies (such as to animals, pollen, food, or mold). Allergens that cause bronchospasm may cause wheezing immediately after exposure or many hours later.   Infection. Viral infections are believed to be the most common cause of bronchospasm.   Exercise.   Irritants (such as pollution, cigarette smoke, strong odors, aerosol sprays, and paint fumes).   Weather changes. Winds increase molds and pollens in the air. Rain refreshes the air by washing irritants out. Cold air may cause inflammation.   Stress and emotional upset.  SIGNS AND SYMPTOMS   Wheezing.   Excessive nighttime coughing.   Frequent or severe coughing with a simple cold.   Chest tightness.   Shortness of breath.  DIAGNOSIS  Bronchospasm is usually diagnosed through a history and physical exam. Tests, such as chest X-rays, are sometimes done to look for other conditions. TREATMENT   Inhaled medicines can be given to open up your airways and help you breathe. The medicines can be given using either an inhaler or a nebulizer machine.  Corticosteroid medicines may be given for severe bronchospasm, usually when it is associated with asthma. HOME CARE INSTRUCTIONS   Always have a plan prepared for seeking medical care. Know when to call your health care provider and local emergency services (911 in the [place] Know where you can access local emergency care.  Only take medicines as  directed by your health care provider.  If you were prescribed an inhaler or nebulizer machine, ask your health care provider to explain how to use it correctly. Always use a spacer with your inhaler if you were given one.  It is necessary to remain calm during an attack. Try to relax and breathe more slowly.  Control your home environment in the following ways:   Change your heating and air conditioning filter at least once a month.   Limit your use of fireplaces and wood stoves.  Do not smoke and do not allow smoking in your home.   Avoid exposure to perfumes and fragrances.   Get rid of pests (such as roaches and mice) and their droppings.   Throw away plants if you see mold on them.   Keep your house clean and dust free.   Replace carpet with wood, tile, or vinyl flooring. Carpet can trap dander and dust.   Use allergy-proof pillows, mattress covers, and box spring covers.   Wash bed sheets and blankets every week in hot water and dry them in a dryer.   Use blankets that are made of polyester or cotton.   Wash hands frequently. SEEK MEDICAL CARE IF:   You have muscle aches.   You have chest pain.   The sputum changes from clear or white to yellow, green, gray, or bloody.   The sputum you cough up gets thicker.   There are problems that may be related to the medicine you are given, such as a rash, itching, swelling, or trouble breathing.  SEEK IMMEDIATE MEDICAL CARE IF:   You have worsening wheezing and coughing even  after taking your prescribed medicines.   You have increased difficulty breathing.   You develop severe chest pain. MAKE SURE YOU:   Understand these instructions.  Will watch your condition.  Will get help right away if you are not doing well or get worse. Document Released: 09/07/2003 Document Revised: 09/09/2013 Document Reviewed: 02/24/2013 [facility] Patient Information 2015 [place]. This information is not  intended to replace advice given to you by your health care provider. Make sure you discuss any questions you have with your health care provider.  Smoking Cessation Quitting smoking is important to your health and has many advantages. However, it is not always easy to quit since nicotine is a very addictive drug. Oftentimes, people try 3 times or more before being able to quit. This document explains the best ways for you to prepare to quit smoking. Quitting takes hard work and a lot of effort, but you can do it. ADVANTAGES OF QUITTING SMOKING  You will live longer, feel better, and live better.  Your body will feel the impact of quitting smoking almost immediately.  Within 20 minutes, blood pressure decreases. Your pulse returns to its normal level.  After 8 hours, carbon monoxide levels in the blood return to normal. Your oxygen level increases.  After 24 hours, the chance of having a heart attack starts to decrease. Your breath, hair, and body stop smelling like smoke.  After 48 hours, damaged nerve endings begin to recover. Your sense of taste and smell improve.  After 72 hours, the body is virtually free of nicotine. Your bronchial tubes relax and breathing becomes easier.  After 2 to 12 weeks, lungs can hold more air. Exercise becomes easier and circulation improves.  The risk of having a heart attack, stroke, cancer, or lung disease is greatly reduced.  After 1 year, the risk of coronary heart disease is cut in half.  After 5 years, the risk of stroke falls to the same as a nonsmoker.  After 10 years, the risk of lung cancer is cut in half and the risk of other cancers decreases significantly.  After 15 years, the risk of coronary heart disease drops, usually to the level of a nonsmoker.  If you are pregnant, quitting smoking will improve your chances of having a healthy baby.  The people you live with, especially any children, will be healthier.  You will have extra money  to spend on things other than cigarettes. QUESTIONS TO THINK ABOUT BEFORE ATTEMPTING TO QUIT You may want to talk about your answers with your health care provider.  Why do you want to quit?  If you tried to quit in the past, what helped and what did not?  What will be the most difficult situations for you after you quit? How will you plan to handle them?  Who can help you through the tough times? Your family? Friends? A health care provider?  What pleasures do you get from smoking? What ways can you still get pleasure if you quit? Here are some questions to ask your health care provider:  How can you help me to be successful at quitting?  What medicine do you think would be best for me and how should I take it?  What should I do if I need more help?  What is smoking withdrawal like? How can I get information on withdrawal? GET READY  Set a quit date.  Change your environment by getting rid of all cigarettes, ashtrays, matches, and lighters in your  home, car, or work. Do not let people smoke in your home.  Review your past attempts to quit. Think about what worked and what did not. GET SUPPORT AND ENCOURAGEMENT You have a better chance of being successful if you have help. You can get support in many ways.  Tell your family, friends, and coworkers that you are going to quit and need their support. Ask them not to smoke around you.  Get individual, group, or telephone counseling and support. Programs are available at [facility]. Call your local health department for information about programs in your area.  Spiritual beliefs and practices may help some smokers quit.  Download a "quit meter" on your computer to keep track of quit statistics, such as how long you have gone without smoking, cigarettes not smoked, and money saved.  Get a self-help book about quitting smoking and staying off tobacco. LEARN NEW SKILLS AND BEHAVIORS  Distract yourself from  urges to smoke. Talk to someone, go for a walk, or occupy your time with a task.  Change your normal routine. Take a different route to work. Drink tea instead of coffee. Eat breakfast in a different place.  Reduce your stress. Take a hot bath, exercise, or read a book.  Plan something enjoyable to do every day. Reward yourself for not smoking.  Explore interactive web-based programs that specialize in helping you quit. GET MEDICINE AND USE IT CORRECTLY Medicines can help you stop smoking and decrease the urge to smoke. Combining medicine with the above behavioral methods and support can greatly increase your chances of successfully quitting smoking.  Nicotine replacement therapy helps deliver nicotine to your body without the negative effects and risks of smoking. Nicotine replacement therapy includes nicotine gum, lozenges, inhalers, nasal sprays, and skin patches. Some may be available over-the-counter and others require a prescription.  Antidepressant medicine helps people abstain from smoking, but how this works is unknown. This medicine is available by prescription.  Nicotinic receptor partial agonist medicine simulates the effect of nicotine in your brain. This medicine is available by prescription. Ask your health care provider for advice about which medicines to use and how to use them based on your health history. Your health care provider will tell you what side effects to look out for if you choose to be on a medicine or therapy. Carefully read the information on the package. Do not use any other product containing nicotine while using a nicotine replacement product.  RELAPSE OR DIFFICULT SITUATIONS Most relapses occur within the first 3 months after quitting. Do not be discouraged if you start smoking again. Remember, most people try several times before finally quitting. You may have symptoms of withdrawal because your body is used to nicotine. You may crave cigarettes, be irritable,  feel very hungry, cough often, get headaches, or have difficulty concentrating. The withdrawal symptoms are only temporary. They are strongest when you first quit, but they will go away within 10-14 days. To reduce the chances of relapse, try to:  Avoid drinking alcohol. Drinking lowers your chances of successfully quitting.  Reduce the amount of caffeine you consume. Once you quit smoking, the amount of caffeine in your body increases and can give you symptoms, such as a rapid heartbeat, sweating, and anxiety.  Avoid smokers because they can make you want to smoke.  Do not let weight gain distract you. Many smokers will gain weight when they quit, usually less than 10 pounds. Eat a healthy diet and stay  active. You can always lose the weight gained after you quit.  Find ways to improve your mood other than smoking. FOR MORE INFORMATION  www.smokefree.gov  Document Released: 08/29/2001 Document Revised: 01/19/2014 Document Reviewed: 12/14/2011 [facility] Patient Information 2015 [place], [place]. This information is not intended to replace advice given to you by your health care provider. Make sure you discuss any questions you have with your health care provider.  Smoking Cessation, Tips for Success If you are ready to quit smoking, congratulations! You have chosen to help yourself be healthier. Cigarettes bring nicotine, tar, carbon monoxide, and other irritants into your body. Your lungs, heart, and blood vessels will be able to work better without these poisons. There are many different ways to quit smoking. Nicotine gum, nicotine patches, a nicotine inhaler, or nicotine nasal spray can help with physical craving. Hypnosis, support groups, and medicines help break the habit of smoking. WHAT THINGS CAN I DO TO MAKE QUITTING EASIER?  Here are some tips to help you quit for good:  Pick a date when you will quit smoking completely. Tell all of your friends and family about your plan to quit on  that date.  Do not try to slowly cut down on the number of cigarettes you are smoking. Pick a quit date and quit smoking completely starting on that day.  Throw away all cigarettes.   Clean and remove all ashtrays from your home, work, and car.  On a card, write down your reasons for quitting. Carry the card with you and read it when you get the urge to smoke.  Cleanse your body of nicotine. Drink enough water and fluids to keep your urine clear or pale yellow. Do this after quitting to flush the nicotine from your body.  Learn to predict your moods. Do not let a bad situation be your excuse to have a cigarette. Some situations in your life might tempt you into wanting a cigarette.  Never have "just one" cigarette. It leads to wanting another and another. Remind yourself of your decision to quit.  Change habits associated with smoking. If you smoked while driving or when feeling stressed, try other activities to replace smoking. Stand up when drinking your coffee. Brush your teeth after eating. Sit in a different chair when you read the paper. Avoid alcohol while trying to quit, and try to drink fewer caffeinated beverages. Alcohol and caffeine may urge you to smoke.  Avoid foods and drinks that can trigger a desire to smoke, such as sugary or spicy foods and alcohol.  Ask people who smoke not to smoke around you.  Have something planned to do right after eating or having a cup of coffee. For example, plan to take a walk or exercise.  Try a relaxation exercise to calm you down and decrease your stress. Remember, you may be tense and nervous for the first 2 weeks after you quit, but this will pass.  Find new activities to keep your hands busy. Play with a pen, coin, or rubber band. Doodle or draw things on paper.  Brush your teeth right after eating. This will help cut down on the craving for the taste of tobacco after meals. You can also try mouthwash.   Use oral substitutes in place  of cigarettes. Try using lemon drops, carrots, cinnamon sticks, or chewing gum. Keep them handy so they are available when you have the urge to smoke.  When you have the urge to smoke, try deep breathing.  Designate your  home as a nonsmoking area.  If you are a heavy smoker, ask your health care provider about a prescription for nicotine chewing gum. It can ease your withdrawal from nicotine.  Reward yourself. Set aside the cigarette money you save and buy yourself something nice.  Look for support from others. Join a support group or smoking cessation program. Ask someone at home or at work to help you with your plan to quit smoking.  Always ask yourself, "Do I need this cigarette or is this just a reflex?" Tell yourself, "Today, I choose not to smoke," or "I do not want to smoke." You are reminding yourself of your decision to quit.  Do not replace cigarette smoking with electronic cigarettes (commonly called e-cigarettes). The safety of e-cigarettes is unknown, and some may contain harmful chemicals.  If you relapse, do not give up! Plan ahead and think about what you will do the next time you get the urge to smoke. HOW WILL I FEEL WHEN I QUIT SMOKING? You may have symptoms of withdrawal because your body is used to nicotine (the addictive substance in cigarettes). You may crave cigarettes, be irritable, feel very hungry, cough often, get headaches, or have difficulty concentrating. The withdrawal symptoms are only temporary. They are strongest when you first quit but will go away within 10-14 days. When withdrawal symptoms occur, stay in control. Think about your reasons for quitting. Remind yourself that these are signs that your body is healing and getting used to being without cigarettes. Remember that withdrawal symptoms are easier to treat than the major diseases that smoking can cause.  Even after the withdrawal is over, expect periodic urges to smoke. However, these cravings are  generally short lived and will go away whether you smoke or not. Do not smoke! WHAT RESOURCES ARE AVAILABLE TO HELP ME QUIT SMOKING? Your health care provider can direct you to community resources or hospitals for support, which may include:  Group support.  Education.  Hypnosis.  Therapy. Document Released: 06/02/2004 Document Revised: 01/19/2014 Document Reviewed: 02/20/2013 [facility] Patient Information 2015 [place], [place]. This information is not intended to replace advice given to you by your health care provider. Make sure you discuss any questions you have with your health care provider.

## 2015-08-04 ENCOUNTER — Emergency Department (HOSPITAL_COMMUNITY): Payer: Self-pay

## 2015-08-04 ENCOUNTER — Observation Stay (HOSPITAL_COMMUNITY)
Admission: EM | Admit: 2015-08-04 | Discharge: 2015-08-05 | Disposition: A | Discharge status: Home / Self Care | Payer: Self-pay | Attending: Internal Medicine | Admitting: Internal Medicine

## 2015-08-04 ENCOUNTER — Encounter (HOSPITAL_COMMUNITY): Payer: Self-pay | Admitting: *Deleted

## 2015-08-04 DIAGNOSIS — Z79899 Other long term (current) drug therapy: Secondary | ICD-10-CM | POA: Insufficient documentation

## 2015-08-04 DIAGNOSIS — Z720 Tobacco use: Secondary | ICD-10-CM | POA: Insufficient documentation

## 2015-08-04 DIAGNOSIS — J45901 Unspecified asthma with (acute) exacerbation: Principal | ICD-10-CM | POA: Diagnosis present

## 2015-08-04 DIAGNOSIS — E876 Hypokalemia: Secondary | ICD-10-CM | POA: Insufficient documentation

## 2015-08-04 DIAGNOSIS — K219 Gastro-esophageal reflux disease without esophagitis: Secondary | ICD-10-CM | POA: Insufficient documentation

## 2015-08-04 HISTORY — DX: Reserved for inherently not codable concepts without codable children: IMO0001

## 2015-08-04 MED ORDER — ALBUTEROL (5 MG/ML) CONTINUOUS INHALATION SOLN
10.0000 mg/h | INHALATION_SOLUTION | RESPIRATORY_TRACT | Status: DC
  Administered 2015-08-04 (×2): 10 mg/h via RESPIRATORY_TRACT
  Filled 2015-08-04: qty 20

## 2015-08-04 MED ORDER — ALBUTEROL SULFATE (2.5 MG/3ML) 0.083% IN NEBU
INHALATION_SOLUTION | RESPIRATORY_TRACT | Status: AC
  Filled 2015-08-04: qty 6

## 2015-08-04 MED ORDER — ALBUTEROL SULFATE (2.5 MG/3ML) 0.083% IN NEBU
5.0000 mg | INHALATION_SOLUTION | Freq: Once | RESPIRATORY_TRACT | Status: AC
  Administered 2015-08-04: 5 mg via RESPIRATORY_TRACT

## 2015-08-04 MED ORDER — IPRATROPIUM BROMIDE 0.02 % IN SOLN
0.5000 mg | Freq: Once | RESPIRATORY_TRACT | Status: AC
  Administered 2015-08-04: 0.5 mg via RESPIRATORY_TRACT
  Filled 2015-08-04: qty 2.5

## 2015-08-04 MED ORDER — DEXAMETHASONE SODIUM PHOSPHATE 10 MG/ML IJ SOLN
16.0000 mg | Freq: Once | INTRAMUSCULAR | Status: AC
  Administered 2015-08-04: 16 mg via INTRAVENOUS
  Filled 2015-08-04: qty 2

## 2015-08-04 MED ORDER — IPRATROPIUM-ALBUTEROL 0.5-2.5 (3) MG/3ML IN SOLN
3.0000 mL | Freq: Once | RESPIRATORY_TRACT | Status: AC
  Administered 2015-08-04: 3 mL via RESPIRATORY_TRACT
  Filled 2015-08-04: qty 3

## 2015-08-04 NOTE — ED Notes (Signed)
Pt reports hx of asthma, having sob since 3pm and is out of inhaler. Wheezing noted at triage, spo2 93%.

## 2015-08-04 NOTE — ED Provider Notes (Signed)
complians of wheezing onset approximate 4 PM today. No other complaint. Feels improved since treatment here. On exam speaks in sentences. Lungs with expiratory wheeze and prolonged expiratory phase.  Doug SouSam Marene Gilliam, MD 08/05/15 [phone]

## 2015-08-04 NOTE — ED Notes (Signed)
Pt reports feeling better after breathing tx, wheezing still noted at triage, spo2 100%.

## 2015-08-04 NOTE — ED Provider Notes (Signed)
Arrival Date & Time: 08/04/15 & 1753 History   Chief Complaint  Patient presents with  . Asthma  . Shortness of Breath   HPI Adam Price is a 49 y.o. male who presents for wheezing concerning for asthma exacerbation. Onset wheezing was approximately 70 around 3:57 PM. Patient no longer has nebulizer and is unable to follow-up with primary care within the past 2 years. Denies chest pain denies abdominal pain back pain urinary symptoms headache neck pain or cough fever chills or productive sputum and denies emesis or diarrhea. Wheezing worse with exposure to cold air better with nebulizer treatment  Past Medical History  I reviewed & agree with nursing's documentation of PMHx, PSHx, SHx & FHx. Past Medical History  Diagnosis Date  . Asthma   . Shortness of breath dyspnea    Past Surgical History  Procedure Laterality Date  . Hernia repair     Social History   Social History  . Marital Status: Legally Separated    Spouse Name: N/A  . Number of Children: N/A  . Years of Education: N/A   Social History Main Topics  . Smoking status: Former Games developer  . Smokeless tobacco: None  . Alcohol Use: No  . Drug Use: No  . Sexual Activity: Not Asked   Other Topics Concern  . None   Social History Narrative   History reviewed. No pertinent family history.  Review of Systems   Complete Review of Systems obtained and is negative except as stated in HPI.  Allergies  Shrimp  Home Medications   Prior to Admission medications   Medication Sig Start Date End Date Taking? Authorizing Provider  doxycycline (VIBRA-TABS) 100 MG tablet Take 1 tablet (100 mg total) by mouth 2 (two) times daily. 08/05/15   Vassie Loll, MD  guaiFENesin (MUCINEX) 600 MG 12 hr tablet Take 1 tablet (600 mg total) by mouth 2 (two) times daily. 08/05/15   Vassie Loll, MD  Ipratropium-Albuterol (COMBIVENT) 20-100 MCG/ACT AERS respimat Inhale 1 puff into the lungs every 6 (six) hours as needed for wheezing or  shortness of breath. 08/05/15   Vassie Loll, MD  omeprazole (PRILOSEC OTC) 20 MG tablet Take 1 tablet (20 mg total) by mouth daily. 08/05/15   Vassie Loll, MD  predniSONE (DELTASONE) 20 MG tablet Take 3 tablets daily X 2 days; then 2 tablets daily X 3 days; then 1 tablet daily X 3 days; then 1/2 tablet daily X 3 days and stop 08/05/15   Vassie Loll, MD    Physical Exam  Vitals & Nursing notes reviewed. BP 118/84 mmHg  Pulse 80  Temp(Src) 98.4 F (36.9 C) (Oral)  Resp 20  SpO2 98% Physical Exam Vitals & Nursing notes reviewed. CONST: male, in no acute distress. Appears WD/WN & stated age. HEAD: [place]/AT. EYES: PERRL. No conjunctival injection & lids symmetrical. ENMT: External nose & ears atraumatic. MM moist.  Oropharynx w/o swelling or exudates. NECK: Supple, w/o meningismus. Trachea midline w/o JVD. Stridor absent. CVS: S1/S2 audible w/o gallops. Murmur absent. Peripheral pulses 2+ & equal in all extremities. Cap refill < 2 seconds. RESP: Respiratory effort increased. Lungs with decreased breath sounds diffusely bilaterally and remarkable wheezing in upper and lower lung fields bilaterally. GI: Soft, w/o TTP. Guarding & rebound absent. BS normal. BACK: W/o CVA TTP bilaterally. SKIN: Warm & dry, w/o rash. W/o open wound. NEURO: AAOx3. CN II-XIII grossly intact.  Sensation w/o deficit. Strength w/o deficit. Tremor absent. PSYCH:  Cooperative, w/ mood & affect appropriate.  MSK: Extremities w/o deformity or TTP.  Joints stable, w/o warmth. W/o cyanosis.   ED Course  Procedures  Labs Review Labs Reviewed  I-STAT CHEM 8, ED - Abnormal; Notable for the following:    Potassium 3.3 (*)    BUN <3 (*)    Glucose, Bld 160 (*)    All other components within normal limits  I-STAT VENOUS BLOOD GAS, ED - Abnormal; Notable for the following:    pH, Ven 7.392 (*)    pCO2, Ven 40.6 (*)    pO2, Ven 47.0 (*)    Bicarbonate 24.7 (*)    All other components within normal limits     Imaging Review Dg Chest 2 View  08/04/2015  CLINICAL DATA:  SOB began earlier today. Hx of asthma - coughs at night. No chest pain but patient says he "feels a knot" mid sternum but says its been there for years due to an injury. EXAM: CHEST - 2 VIEW COMPARISON:  10/27/2014 FINDINGS: Coarse perihilar interstitial opacities as before. No confluent airspace disease or edema. Heart size normal. No pneumothorax. No effusion. Degenerative changes in bilateral shoulders. IMPRESSION: No acute disease. Electronically Signed   By: Corlis Leak  Hassell M.D.   On: 08/04/2015 18:40    I personally visualized all Labs & Imaging results, which were used in the medical decision making of this patient's treatment and disposition.  EKG Interpretation  EKG Interpretation  Date/Time:    Ventricular Rate:    PR Interval:    QRS Duration:   QT Interval:    QTC Calculation:   R Axis:     Text Interpretation:        MDM  Adam Price is a 49 y.o. male with H&P as above. ED clinical course as follows:  Patient presents with this patient consistent with asthma exacerbation. Patient's chest originally quite therefore continuous nebulizer treatment provided 3 with albuterol and ipratropium. Patient also given intravenous fluid and magnesium. Chest x-ray reveals no acute evidence of infiltrate or other cardiopulmonary abnormalities at this time. Do not suspect pneumothorax per chest x-ray. Other laboratory work unremarkable do not suspect cardiac etiology given no history of heart failure or coronary artery disease and presentation inconsistent with such etiologies. EKG reveals normal sinus rhythm without anatomical ischemia or evidence of STEMI. Due to patient's inability to resolve diffuse wheezing bilaterally obtained consultation from hospitalist and after we discussed patient's ED clinical course and imaging with laboratory work decision was made to admit for observation of asthma  exacerbation.  Disposition: Admit  Clinical Impression:  1. Asthma exacerbation    Patient care discussed with Dr. Ethelda Chickjacubowitz, who oversaw their evaluation & treatment & voiced agreement. House Officer: Jonette EvaBrad Dael Howland, MD, Emergency Medicine Resident.  Jonette EvaBrad Zoei Amison, MD 08/06/15 [id number]  Doug SouSam Jacubowitz, MD 08/07/15 [id number]

## 2015-08-05 ENCOUNTER — Encounter (HOSPITAL_COMMUNITY): Payer: Self-pay | Admitting: *Deleted

## 2015-08-05 DIAGNOSIS — F17200 Nicotine dependence, unspecified, uncomplicated: Secondary | ICD-10-CM

## 2015-08-05 DIAGNOSIS — K219 Gastro-esophageal reflux disease without esophagitis: Secondary | ICD-10-CM

## 2015-08-05 DIAGNOSIS — Z720 Tobacco use: Secondary | ICD-10-CM | POA: Insufficient documentation

## 2015-08-05 DIAGNOSIS — E876 Hypokalemia: Secondary | ICD-10-CM

## 2015-08-05 DIAGNOSIS — J45901 Unspecified asthma with (acute) exacerbation: Secondary | ICD-10-CM

## 2015-08-05 DIAGNOSIS — R739 Hyperglycemia, unspecified: Secondary | ICD-10-CM

## 2015-08-05 LAB — I-STAT VENOUS BLOOD GAS, ED
BICARBONATE: 24.7 meq/L — AB (ref 20.0–24.0)
O2 Saturation: 83 %
PCO2 VEN: 40.6 mmHg — AB (ref 45.0–50.0)
PO2 VEN: 47 mmHg — AB (ref 30.0–45.0)
TCO2: 26 mmol/L (ref 0–100)
pH, Ven: 7.392 — ABNORMAL HIGH (ref 7.250–7.300)

## 2015-08-05 LAB — I-STAT CHEM 8, ED
CREATININE: 0.7 mg/dL (ref 0.61–1.24)
Calcium, Ion: 1.15 mmol/L (ref 1.12–1.23)
Chloride: 101 mmol/L (ref 101–111)
Glucose, Bld: 160 mg/dL — ABNORMAL HIGH (ref 65–99)
HEMATOCRIT: 43 % (ref 39.0–52.0)
HEMOGLOBIN: 14.6 g/dL (ref 13.0–17.0)
POTASSIUM: 3.3 mmol/L — AB (ref 3.5–5.1)
SODIUM: 139 mmol/L (ref 135–145)
TCO2: 22 mmol/L (ref 0–100)

## 2015-08-05 MED ORDER — PREDNISONE 50 MG PO TABS
50.0000 mg | ORAL_TABLET | Freq: Every day | ORAL | Status: DC
  Administered 2015-08-05: 50 mg via ORAL
  Filled 2015-08-05: qty 1

## 2015-08-05 MED ORDER — IPRATROPIUM-ALBUTEROL 20-100 MCG/ACT IN AERS: 1.0000 | INHALATION_SPRAY | Freq: Four times a day (QID) | RESPIRATORY_TRACT | Status: DC | PRN

## 2015-08-05 MED ORDER — GUAIFENESIN ER 600 MG PO TB12: 600.0000 mg | ORAL_TABLET | Freq: Two times a day (BID) | ORAL | Status: DC

## 2015-08-05 MED ORDER — OMEPRAZOLE MAGNESIUM 20 MG PO TBEC: 20.0000 mg | DELAYED_RELEASE_TABLET | Freq: Every day | ORAL | Status: DC

## 2015-08-05 MED ORDER — HEPARIN SODIUM (PORCINE) 5000 UNIT/ML IJ SOLN
5000.0000 [IU] | Freq: Three times a day (TID) | INTRAMUSCULAR | Status: DC
  Administered 2015-08-05 (×2): 5000 [IU] via SUBCUTANEOUS
  Filled 2015-08-05: qty 1

## 2015-08-05 MED ORDER — SODIUM CHLORIDE 0.9 % IV BOLUS (SEPSIS): 1000.0000 mL | Freq: Once | INTRAVENOUS | Status: DC

## 2015-08-05 MED ORDER — DOXYCYCLINE HYCLATE 100 MG PO TABS: 100.0000 mg | ORAL_TABLET | Freq: Two times a day (BID) | ORAL | Status: DC

## 2015-08-05 MED ORDER — PREDNISONE 20 MG PO TABS: ORAL_TABLET | ORAL | Status: DC

## 2015-08-05 MED ORDER — SODIUM CHLORIDE 0.9 % IV BOLUS (SEPSIS)
1000.0000 mL | Freq: Once | INTRAVENOUS | Status: AC
  Administered 2015-08-05: 1000 mL via INTRAVENOUS

## 2015-08-05 MED ORDER — MAGNESIUM SULFATE 2 GM/50ML IV SOLN
2.0000 g | Freq: Once | INTRAVENOUS | Status: AC
  Administered 2015-08-05: 2 g via INTRAVENOUS
  Filled 2015-08-05: qty 50

## 2015-08-05 NOTE — Care Management Note (Signed)
Case Management Note  Patient Details  Name: Eartha InchKelvin M Viles MRN: [id number] Date of Birth: 04/29/1966  Subjective/Objective:            CM following for progression and d/c planning.        Action/Plan: Pt for d/c to home no HH or DME needs. Pt scheduled for followup appointment at [facility] , Aug 30, 2015 @ 1:45pm as no appointments are available [facility] and [facility] at this time.   Expected Discharge Date:     08/05/2015             Expected Discharge Plan:  Home/Self Care  In-House Referral:  NA  Discharge planning Services  NA  Post Acute Care Choice:  NA Choice offered to:  NA  DME Arranged:   NA DME Agency:   NA  HH Arranged:   NA HH Agency:   NA  Status of Service:  Completed, signed off  Medicare Important Message Given:    Date Medicare IM Given:    Medicare IM give by:    Date Additional Medicare IM Given:    Additional Medicare Important Message give by:     If discussed at Long Length of Stay Meetings, dates discussed:    Additional Comments:  Starlyn SkeansRoyal, Renard Caperton U, RN 08/05/2015, 4:25 PM

## 2015-08-05 NOTE — H&P (Signed)
[facility] History and Physical  Adam InchKelvin M Cumming [id number] DOB: [birth year] DOA: 08/04/2015  Referring physician: EDP PCP: No PCP Per Patient   Chief Complaint: Asthma exacerbation   HPI: Adam Price is a 49 y.o. male with h/o asthma.  Patient presents to the ED with c/o exacerbation.  Wheezing onset at about 4 pm today.  Required multiple neb treatments in ED before he felt better.  Still having some wheezing in ED.  Review of Systems: Systems reviewed.  As above, otherwise negative  Past Medical History  Diagnosis Date  . Asthma    History reviewed. No pertinent past surgical history. Social History:  reports that he has quit smoking. He does not have any smokeless tobacco history on file. He reports that he does not drink alcohol or use illicit drugs.  Allergies  Allergen Reactions  . Shrimp [Shellfish Allergy] Shortness Of Breath    History reviewed. No pertinent family history.   Prior to Admission medications   Medication Sig Start Date End Date Taking? Authorizing Provider  albuterol (PROVENTIL HFA;VENTOLIN HFA) 108 (90 BASE) MCG/ACT inhaler Inhale 2 puffs into the lungs every 6 (six) hours as needed for wheezing or shortness of breath. 10/27/14  Yes Mancel BaleElliott Wentz, MD  predniSONE (DELTASONE) 20 MG tablet Take 1 tablet (20 mg total) by mouth 2 (two) times daily. Patient not taking: Reported on 08/04/2015 10/27/14   Mancel BaleElliott Wentz, MD   Physical Exam: Filed Vitals:   08/05/15 0046  BP: 129/94  Pulse: 99  Temp:   Resp: 22    BP 129/94 mmHg  Pulse 99  Temp(Src) 98.4 F (36.9 C) (Oral)  Resp 22  SpO2 94%  General Appearance:    Alert, oriented, no distress, appears stated age  Head:    Normocephalic, atraumatic  Eyes:    PERRL, EOMI, sclera non-icteric        Nose:   Nares without drainage or epistaxis. Mucosa, turbinates normal  Throat:   Moist mucous membranes. Oropharynx without erythema or exudate.  Neck:   Supple. No carotid bruits.  No  thyromegaly.  No lymphadenopathy.   Back:     No CVA tenderness, no spinal tenderness  Lungs:     Wheezing and prolonged expiratory phase  Chest wall:    No tenderness to palpitation  Heart:    Regular rate and rhythm without murmurs, gallops, rubs  Abdomen:     Soft, non-tender, nondistended, normal bowel sounds, no organomegaly  Genitalia:    deferred  Rectal:    deferred  Extremities:   No clubbing, cyanosis or edema.  Pulses:   2+ and symmetric all extremities  Skin:   Skin color, texture, turgor normal, no rashes or lesions  Lymph nodes:   Cervical, supraclavicular, and axillary nodes normal  Neurologic:   CNII-XII intact. Normal strength, sensation and reflexes      throughout    Labs on Admission:  Basic Metabolic Panel:  Recent Labs Lab 08/05/15 0016  NA 139  K 3.3*  CL 101  GLUCOSE 160*  BUN <3*  CREATININE 0.70   Liver Function Tests: No results for input(s): AST, ALT, ALKPHOS, BILITOT, PROT, ALBUMIN in the last 168 hours. No results for input(s): LIPASE, AMYLASE in the last 168 hours. No results for input(s): AMMONIA in the last 168 hours. CBC:  Recent Labs Lab 08/05/15 0016  HGB 14.6  HCT 43.0   Cardiac Enzymes: No results for input(s): CKTOTAL, CKMB, CKMBINDEX, TROPONINI in the last 168 hours.  BNP (  last 3 results) No results for input(s): PROBNP in the last 8760 hours. CBG: No results for input(s): GLUCAP in the last 168 hours.  Radiological Exams on Admission: Dg Chest 2 View  08/04/2015  CLINICAL DATA:  SOB began earlier today. Hx of asthma - coughs at night. No chest pain but patient says he "feels a knot" mid sternum but says its been there for years due to an injury. EXAM: CHEST - 2 VIEW COMPARISON:  10/27/2014 FINDINGS: Coarse perihilar interstitial opacities as before. No confluent airspace disease or edema. Heart size normal. No pneumothorax. No effusion. Degenerative changes in bilateral shoulders. IMPRESSION: No acute disease. Electronically  Signed   By: Corlis Leak M.D.   On: 08/04/2015 18:40    EKG: Independently reviewed.  Assessment/Plan Active Problems:   Asthma exacerbation   1. Asthma exacerbation - 1. Adult wheeze protocol 2. Prednisone PO daily 3. Thankfully patient quit smoking several months ago.    Code Status: Full  Family Communication: Family at bedside Disposition Plan: Admit to obs   Time spent: 30 min  GARDNER, JARED M. [facility] Pager [phone]  If 7AM-7PM, please contact the day team taking care of the patient [email or web address] Password [email or web address] 08/05/2015, 1:06 AM

## 2015-08-05 NOTE — Care Management Note (Signed)
Case Management Note  Patient Details  Name: Adam Price MRN: [id number] Date of Birth: 03/30/1966  Subjective/Objective:             CM following for progression and d/c planning.       Action/Plan: Plan to d/c pt to home today.   Expected Discharge Date:    08/05/2015              Expected Discharge Plan:  Home/Self Care  In-House Referral:  NA  Discharge planning Services  NA  Post Acute Care Choice:  NA Choice offered to:  NA  DME Arranged:   NA DME Agency:   NA  HH Arranged:   NA HH Agency:   NA  Status of Service:  Completed, signed off  Medicare Important Message Given:    Date Medicare IM Given:    Medicare IM give by:    Date Additional Medicare IM Given:    Additional Medicare Important Message give by:     If discussed at Long Length of Stay Meetings, dates discussed:    Additional Comments:  Starlyn SkeansRoyal, Shanaut[place] Forker U, RN 08/05/2015, 12:16 PM

## 2015-08-05 NOTE — Progress Notes (Signed)
IV removed, discharge instructions explained, and patient was discharged from facility. Patient wanted to walk instead of use of wheelchair.

## 2015-08-05 NOTE — Discharge Summary (Signed)
Physician Discharge Summary  Eartha InchKelvin M Enfield [id number] DOB: 31-Jan-1966 DOA: 08/04/2015  PCP: No PCP Per Patient  Admit date: 08/04/2015 Discharge date: 08/05/2015  Time spent: 40 minutes  Recommendations for Outpatient Follow-up:  1. Will benefit of outpatient pulmonary evaluation for PFT's 2. Reassess CBG's and A1C, patient with hyperglycemia  3. Repeat BEMT to follow electrolytes   Discharge Diagnoses:  Asthma exacerbation/COPD exacerbation Bronchitis/bronchiectasis Tobacco abuse Hypokalemia Hyperglycemia GERD   Discharge Condition: stable and improved. Discharge home, speaking in full sentences and just mild wheezing   Diet recommendation: regular diet ( but with instructions to watch amount of carbohydrates)  There were no vitals filed for this visit.  History of present illness:  49 y.o. male with h/o asthma and tobacco abuse. Patient presents to the ED with c/o exacerbation and productive cough. Wheezing onset at about 4 pm today. Required multiple neb treatments in ED before he felt better. Still having some wheezing in ED.  Hospital Course:  1-Asthma/COPD exacerbation: patient with hx of smoking and has never been checked for PFT's -given concerns for bronchitis/bronchiectasis as part of his asthma/COPD exacerbation will treat with doxycycline BID; along with antibiotics patient will continue PRN use of combivent and performed steroids tapering. Will also use mucinex  2-GERD/GI prophylaxis: will use prilosec  3-hypokalemia: repleted  4-tobacco abuse: cessation counseling provided  5-hyperglycemia: due to use of steroids -will need A1C and CBG's follow up as an outpatient -meanwhile advise to watch amount of carbohydrates intake   Procedures:  See below for x-ray reports   Consultations:  None   Discharge Exam: Filed Vitals:   08/05/15 0926  BP: 122/92  Pulse: 78  Temp: 98.4 F (36.9 C)  Resp: 20    General: afebrile, no CP and speaking  in full sentences. Patient breathing better and with good O2 sat on RA Cardiovascular: S1 and s2, no rubs or gallops Respiratory: mild exp wheezing, scattered rhonchi, good air movement  Abd: soft, NT, ND, positive BS  Discharge Instructions   Discharge Instructions    Discharge instructions    Complete by:  As directed   Apply humidifier at home while running heating Limit exposure to cold temp, to decrease chance of bronchospasm  Take medications as prescribed Arrange follow up with PCP in 14 days  No smoking and avoid second hand smoking Maintain yourself well hydrated          Current Discharge Medication List    START taking these medications   Details  doxycycline (VIBRA-TABS) 100 MG tablet Take 1 tablet (100 mg total) by mouth 2 (two) times daily. Qty: 14 tablet, Refills: 0    guaiFENesin (MUCINEX) 600 MG 12 hr tablet Take 1 tablet (600 mg total) by mouth 2 (two) times daily. Qty: 30 tablet, Refills: 0    Ipratropium-Albuterol (COMBIVENT) 20-100 MCG/ACT AERS respimat Inhale 1 puff into the lungs every 6 (six) hours as needed for wheezing or shortness of breath. Qty: 1 Inhaler, Refills: 3    omeprazole (PRILOSEC OTC) 20 MG tablet Take 1 tablet (20 mg total) by mouth daily. Qty: 30 tablet, Refills: 0      CONTINUE these medications which have CHANGED   Details  predniSONE (DELTASONE) 20 MG tablet Take 3 tablets daily X 2 days; then 2 tablets daily X 3 days; then 1 tablet daily X 3 days; then 1/2 tablet daily X 3 days and stop Qty: 18 tablet, Refills: 0      STOP taking these medications  albuterol (PROVENTIL HFA;VENTOLIN HFA) 108 (90 BASE) MCG/ACT inhaler        Allergies  Allergen Reactions  . Shrimp [Shellfish Allergy] Shortness Of Breath    The results of significant diagnostics from this hospitalization (including imaging, microbiology, ancillary and laboratory) are listed below for reference.    Significant Diagnostic Studies: Dg Chest 2  View  08/04/2015  CLINICAL DATA:  SOB began earlier today. Hx of asthma - coughs at night. No chest pain but patient says he "feels a knot" mid sternum but says its been there for years due to an injury. EXAM: CHEST - 2 VIEW COMPARISON:  10/27/2014 FINDINGS: Coarse perihilar interstitial opacities as before. No confluent airspace disease or edema. Heart size normal. No pneumothorax. No effusion. Degenerative changes in bilateral shoulders. IMPRESSION: No acute disease. Electronically Signed   By: Corlis Leak M.D.   On: 08/04/2015 18:40   Labs: Basic Metabolic Panel:  Recent Labs Lab 08/05/15 0016  NA 139  K 3.3*  CL 101  GLUCOSE 160*  BUN <3*  CREATININE 0.70   CBC:  Recent Labs Lab 08/05/15 0016  HGB 14.6  HCT 43.0   BNP (last 3 results)  Recent Labs  10/27/14 1251  BNP 40.3    Signed:  Vassie Loll  [facility] 08/05/2015, 3:38 PM

## 2015-08-30 ENCOUNTER — Ambulatory Visit: Payer: Self-pay | Admitting: Family Medicine

## 2016-08-04 ENCOUNTER — Encounter (HOSPITAL_COMMUNITY): Payer: Self-pay

## 2016-08-04 ENCOUNTER — Emergency Department (HOSPITAL_COMMUNITY)
Admission: EM | Admit: 2016-08-04 | Discharge: 2016-08-04 | Disposition: A | Discharge status: Home / Self Care | Payer: Self-pay | Attending: Emergency Medicine | Admitting: Emergency Medicine

## 2016-08-04 DIAGNOSIS — J45909 Unspecified asthma, uncomplicated: Secondary | ICD-10-CM | POA: Insufficient documentation

## 2016-08-04 DIAGNOSIS — Z96642 Presence of left artificial hip joint: Secondary | ICD-10-CM | POA: Insufficient documentation

## 2016-08-04 DIAGNOSIS — F17200 Nicotine dependence, unspecified, uncomplicated: Secondary | ICD-10-CM | POA: Insufficient documentation

## 2016-08-04 DIAGNOSIS — G8929 Other chronic pain: Secondary | ICD-10-CM | POA: Insufficient documentation

## 2016-08-04 DIAGNOSIS — M5442 Lumbago with sciatica, left side: Secondary | ICD-10-CM | POA: Insufficient documentation

## 2016-08-04 MED ORDER — KETOROLAC TROMETHAMINE 60 MG/2ML IM SOLN
60.0000 mg | Freq: Once | INTRAMUSCULAR | Status: AC
  Administered 2016-08-04: 60 mg via INTRAMUSCULAR
  Filled 2016-08-04: qty 2

## 2016-08-04 MED ORDER — HYDROCODONE-ACETAMINOPHEN 5-325 MG PO TABS
1.0000 | ORAL_TABLET | Freq: Once | ORAL | Status: AC
  Administered 2016-08-04: 1 via ORAL
  Filled 2016-08-04: qty 1

## 2016-08-04 MED ORDER — PREDNISONE 20 MG PO TABS
60.0000 mg | ORAL_TABLET | Freq: Once | ORAL | Status: AC
  Administered 2016-08-04: 60 mg via ORAL
  Filled 2016-08-04: qty 3

## 2016-08-04 MED ORDER — METHOCARBAMOL 500 MG PO TABS: 500.0000 mg | ORAL_TABLET | Freq: Two times a day (BID) | ORAL | 0 refills | Status: DC

## 2016-08-04 MED ORDER — LIDOCAINE 5 % EX PTCH: 1.0000 | MEDICATED_PATCH | CUTANEOUS | 0 refills | Status: DC

## 2016-08-04 MED ORDER — PREDNISONE 10 MG (21) PO TBPK: 10.0000 mg | ORAL_TABLET | Freq: Every day | ORAL | 0 refills | Status: DC

## 2016-08-04 MED ORDER — NAPROXEN 500 MG PO TABS: 500.0000 mg | ORAL_TABLET | Freq: Two times a day (BID) | ORAL | 0 refills | Status: DC

## 2016-08-04 NOTE — ED Triage Notes (Signed)
Pt. Reports having lt. Side lower back pain that radiates into his lt. Anterior thigh.  He reports that the pain increases with movement and when he stands on his lt. Leg.  He denies any numbness tingling.  He denies any n/v/d  , urinary symptoms , penile symptoms or bowel symptoms. He denies any cold symptoms but reports that cough increases the pain.  Skin warm and dry. Alert and oriented X 4

## 2016-08-04 NOTE — Discharge Instructions (Signed)
Take it easy, but do not lay around too much as this may make the stiffness worse. Take 500 mg of naproxen every 12 hours or 800 mg of ibuprofen every 8 hours for the next 3 days. Take these medications with food to avoid upset stomach. Robaxin is a muscle relaxer and may help loosen stiff muscles. Do not take the Robaxin while driving or performing other dangerous activities. Be sure to perform the attached exercises starting with three times a week and working up to performing them daily. This is an essential part of preventing long term problems. Follow up with a primary care provider for any future management of these complaints.

## 2016-08-04 NOTE — ED Provider Notes (Signed)
[facility]-EMERGENCY DEPT Provider Note   CSN: [id number] Arrival date & time: 08/04/16  0848  By signing my name below, I, Clovis Pu, attest that this documentation has been prepared under the direction and in the presence of  Shawn Joy, PA-C. Electronically Signed: Clovis Pu, ED Scribe. 08/04/16. 9:21 AM.   History   Chief Complaint Chief Complaint  Patient presents with  . Back Pain   The history is provided by the patient. No language interpreter was used.   HPI Comments:  Adam Price is a 50 y.o. male, with a PSHx of hip replacement, who presents to the Emergency Department complaining of constant, aching left sided lower back pain x 2 days. He notes associated left sided hip pain. Pt states his pain radiates across his lower back. Patient states this pain has been present intermittently since his left hip replacement a number of years ago. Pt is ambulatory. He has taken Goody powder with no relief. No known drug allergies. Pt denies any recent falls, trauma, neuro deficits, changes in bowel or bladder function, or any other complaints.   Past Medical History:  Diagnosis Date  . Asthma   . Shortness of breath dyspnea     Patient Active Problem List   Diagnosis Date Noted  . Asthma exacerbation 08/05/2015  . Tobacco abuse   . Esophageal reflux   . Hypokalemia     Past Surgical History:  Procedure Laterality Date  . HERNIA REPAIR         Home Medications    Prior to Admission medications   Medication Sig Start Date End Date Taking? Authorizing Provider  guaiFENesin (MUCINEX) 600 MG 12 hr tablet Take 1 tablet (600 mg total) by mouth 2 (two) times daily. 08/05/15   Vassie Loll, MD  Ipratropium-Albuterol (COMBIVENT) 20-100 MCG/ACT AERS respimat Inhale 1 puff into the lungs every 6 (six) hours as needed for wheezing or shortness of breath. 08/05/15   Vassie Loll, MD  lidocaine (LIDODERM) 5 % Place 1 patch onto the skin daily. Remove & Discard patch within 12  hours or as directed by MD 08/04/16   Anselm Pancoast, PA-C  methocarbamol (ROBAXIN) 500 MG tablet Take 1 tablet (500 mg total) by mouth 2 (two) times daily. 08/04/16   Shawn C Joy, PA-C  naproxen (NAPROSYN) 500 MG tablet Take 1 tablet (500 mg total) by mouth 2 (two) times daily. 08/04/16   Shawn C Joy, PA-C  omeprazole (PRILOSEC OTC) 20 MG tablet Take 1 tablet (20 mg total) by mouth daily. 08/05/15   Vassie Loll, MD  predniSONE (STERAPRED UNI-PAK 21 TAB) 10 MG (21) TBPK tablet Take 1 tablet (10 mg total) by mouth daily. Take 6 tabs day 1, 5 tabs day 2, 4 tabs day 3, 3 tabs day 4, 2 tabs day 5, and 1 tab on day 6. 08/04/16   Anselm Pancoast, PA-C    Family History History reviewed. No pertinent family history.  Social History Social History  Substance Use Topics  . Smoking status: Current Every Day Smoker  . Smokeless tobacco: Never Used  . Alcohol use No     Allergies   Shrimp [shellfish allergy]   Review of Systems Review of Systems  Constitutional: Negative for chills and fever.  Gastrointestinal: Negative for nausea and vomiting.  Musculoskeletal: Positive for arthralgias and back pain. Negative for neck pain.  Neurological: Negative for weakness and numbness.  All other systems reviewed and are negative.    Physical Exam Updated Vital Signs  BP 127/92 (BP Location: Left Arm)   Pulse 92   Temp 98.3 F (36.8 C) (Oral)   Resp 22   SpO2 99%   Physical Exam  Constitutional: He appears well-developed and well-nourished. No distress.  HENT:  Head: Normocephalic and atraumatic.  Eyes: Conjunctivae are normal.  Neck: Neck supple.  Cardiovascular: Normal rate, regular rhythm and intact distal pulses.   Pulmonary/Chest: Effort normal. No respiratory distress.  Abdominal: There is no guarding.  Musculoskeletal: He exhibits tenderness. He exhibits no edema.  Motor function intact in all extremities and spine. No midline spinal tenderness. Tenderness in left lumbar musculature and  lateral and posterior left hip.  Lymphadenopathy:    He has no cervical adenopathy.  Neurological: He is alert.  No sensory deficits. Strength 5/5 in all extremities. No gait disturbance. Coordination intact.   Skin: Skin is warm and dry. He is not diaphoretic.  Psychiatric: He has a normal mood and affect. His behavior is normal.  Nursing note and vitals reviewed.    ED Treatments / Results  DIAGNOSTIC STUDIES:  Oxygen Saturation is 99% on RA, normal by my interpretation.    COORDINATION OF CARE:  9:11 AM Discussed treatment plan with pt at bedside and pt agreed to plan.  Labs (all labs ordered are listed, but only abnormal results are displayed) Labs Reviewed - No data to display  EKG  EKG Interpretation None       Radiology No results found.  Procedures Procedures (including critical care time)  Medications Ordered in ED Medications  ketorolac (TORADOL) injection 60 mg (60 mg Intramuscular Given 08/04/16 0922)  predniSONE (DELTASONE) tablet 60 mg (60 mg Oral Given 08/04/16 0922)  HYDROcodone-acetaminophen (NORCO/VICODIN) 5-325 MG per tablet 1 tablet (1 tablet Oral Given 08/04/16 [id number])     Initial Impression / Assessment and Plan / ED Course  I have reviewed the triage vital signs and the nursing notes.  Pertinent labs & imaging results that were available during my care of the patient were reviewed by me and considered in my medical decision making (see chart for details).  Clinical Course     Patient presents with acute on chronic left lower back and left hip pain.No neuro or functional deficits. No symptoms that would give concern for cauda equina. PCP follow-up. The patient was given instructions for home care as well as return precautions. Patient voices understanding of these instructions, accepts the plan, and is comfortable with discharge.     Final Clinical Impressions(s) / ED Diagnoses   Final diagnoses:  Chronic left-sided low back pain with  left-sided sciatica    New Prescriptions Discharge Medication List as of 08/04/2016  9:37 AM    START taking these medications   Details  lidocaine (LIDODERM) 5 % Place 1 patch onto the skin daily. Remove & Discard patch within 12 hours or as directed by MD, Starting Fri 08/04/2016, Print    methocarbamol (ROBAXIN) 500 MG tablet Take 1 tablet (500 mg total) by mouth 2 (two) times daily., Starting Fri 08/04/2016, Print    naproxen (NAPROSYN) 500 MG tablet Take 1 tablet (500 mg total) by mouth 2 (two) times daily., Starting Fri 08/04/2016, Print    predniSONE (STERAPRED UNI-PAK 21 TAB) 10 MG (21) TBPK tablet Take 1 tablet (10 mg total) by mouth daily. Take 6 tabs day 1, 5 tabs day 2, 4 tabs day 3, 3 tabs day 4, 2 tabs day 5, and 1 tab on day 6., Starting Fri 08/04/2016, Print  I personally performed the services described in this documentation, which was scribed in my presence. The recorded information has been reviewed and is accurate.    Anselm PancoastShawn C Joy, PA-C 08/04/16 1017    Gwyneth SproutWhitney Plunkett, MD 08/05/16 [phone]

## 2018-09-04 ENCOUNTER — Encounter (HOSPITAL_COMMUNITY): Payer: Self-pay | Admitting: *Deleted

## 2018-09-04 ENCOUNTER — Other Ambulatory Visit: Payer: Self-pay

## 2018-09-04 ENCOUNTER — Emergency Department (HOSPITAL_COMMUNITY)
Admission: EM | Admit: 2018-09-04 | Discharge: 2018-09-05 | Disposition: A | Discharge status: Home / Self Care | Payer: Self-pay | Attending: Emergency Medicine | Admitting: Emergency Medicine

## 2018-09-04 DIAGNOSIS — R51 Headache: Secondary | ICD-10-CM | POA: Insufficient documentation

## 2018-09-04 DIAGNOSIS — F17210 Nicotine dependence, cigarettes, uncomplicated: Secondary | ICD-10-CM | POA: Insufficient documentation

## 2018-09-04 DIAGNOSIS — R0981 Nasal congestion: Secondary | ICD-10-CM

## 2018-09-04 DIAGNOSIS — Z79899 Other long term (current) drug therapy: Secondary | ICD-10-CM | POA: Insufficient documentation

## 2018-09-04 NOTE — ED Triage Notes (Signed)
Nasal congestion and headache for about 2 months

## 2018-09-05 MED ORDER — AMOXICILLIN-POT CLAVULANATE 875-125 MG PO TABS: 1.0000 | ORAL_TABLET | Freq: Two times a day (BID) | ORAL | 0 refills | Status: DC

## 2018-09-05 NOTE — ED Provider Notes (Signed)
[facility]   CSN: [id number] Arrival date & time: 09/04/18  2339     History   Chief Complaint Chief Complaint  Patient presents with  . Nasal Congestion    HPI Adam Price is a 52 y.o. male.  Patient presents to the emergency department with a chief complaint of sinus congestion x2 months.  He reports associated sinus pressure and headache.  Denies any fevers or chills.  Denies any treatments prior to arrival.  Denies any productive cough.  Occasionally has trouble focusing while reading.    Additionally, reports having had a tick bite in the summer, but this was treated.  The history is provided by the patient. No language interpreter was used.    Past Medical History:  Diagnosis Date  . Asthma   . Shortness of breath dyspnea     Patient Active Problem List   Diagnosis Date Noted  . Asthma exacerbation 08/05/2015  . Tobacco abuse   . Esophageal reflux   . Hypokalemia     Past Surgical History:  Procedure Laterality Date  . HERNIA REPAIR          Home Medications    Prior to Admission medications   Medication Sig Start Date End Date Taking? Authorizing Provider  guaiFENesin (MUCINEX) 600 MG 12 hr tablet Take 1 tablet (600 mg total) by mouth 2 (two) times daily. 08/05/15   Vassie LollMadera, Carlos, MD  Ipratropium-Albuterol (COMBIVENT) 20-100 MCG/ACT AERS respimat Inhale 1 puff into the lungs every 6 (six) hours as needed for wheezing or shortness of breath. 08/05/15   Vassie LollMadera, Carlos, MD  lidocaine (LIDODERM) 5 % Place 1 patch onto the skin daily. Remove & Discard patch within 12 hours or as directed by MD 08/04/16   Joy, Shawn C, PA-C  methocarbamol (ROBAXIN) 500 MG tablet Take 1 tablet (500 mg total) by mouth 2 (two) times daily. 08/04/16   Joy, Shawn C, PA-C  naproxen (NAPROSYN) 500 MG tablet Take 1 tablet (500 mg total) by mouth 2 (two) times daily. 08/04/16   Joy, Shawn C, PA-C  omeprazole (PRILOSEC OTC) 20  MG tablet Take 1 tablet (20 mg total) by mouth daily. 08/05/15   Vassie LollMadera, Carlos, MD  predniSONE (STERAPRED UNI-PAK 21 TAB) 10 MG (21) TBPK tablet Take 1 tablet (10 mg total) by mouth daily. Take 6 tabs day 1, 5 tabs day 2, 4 tabs day 3, 3 tabs day 4, 2 tabs day 5, and 1 tab on day 6. 08/04/16   Joy, Hillard DankerShawn C, PA-C    Family History No family history on file.  Social History Social History   Tobacco Use  . Smoking status: Current Every Day Smoker  . Smokeless tobacco: Never Used  Substance Use Topics  . Alcohol use: Yes  . Drug use: No     Allergies   Shrimp [shellfish allergy]   Review of Systems Review of Systems  Constitutional: Negative for chills and fever.  HENT: Positive for sinus pressure.   Respiratory: Negative for shortness of breath.   Cardiovascular: Negative for chest pain.  Gastrointestinal: Negative for constipation, diarrhea, nausea and vomiting.  Genitourinary: Negative for dysuria.  All other systems reviewed and are negative.    Physical Exam Updated Vital Signs BP 130/87   Pulse 82   Temp 97.7 F (36.5 C) (Oral)   Resp 18   SpO2 98%   Physical Exam Vitals signs and nursing note reviewed.  Constitutional:  Appearance: He is well-developed.  HENT:     Head: Normocephalic and atraumatic.     Right Ear: External ear normal.     Left Ear: External ear normal.     Mouth/Throat:     Pharynx: No oropharyngeal exudate.  Eyes:     Conjunctiva/sclera: Conjunctivae normal.     Pupils: Pupils are equal, round, and reactive to light.  Neck:     Musculoskeletal: Normal range of motion and neck supple.  Cardiovascular:     Rate and Rhythm: Normal rate and regular rhythm.     Heart sounds: Normal heart sounds.  Pulmonary:     Effort: Pulmonary effort is normal. No respiratory distress.     Breath sounds: Normal breath sounds. No wheezing or rales.  Chest:     Chest wall: No tenderness.  Abdominal:     General: Bowel sounds are normal.      Palpations: Abdomen is soft.  Musculoskeletal: Normal range of motion.  Skin:    General: Skin is warm and dry.  Neurological:     Mental Status: He is alert and oriented to person, place, and time.  Psychiatric:        Behavior: Behavior normal.        Thought Content: Thought content normal.        Judgment: Judgment normal.      ED Treatments / Results  Labs (all labs ordered are listed, but only abnormal results are displayed) Labs Reviewed - No data to display  EKG None  Radiology No results found.  Procedures Procedures (including critical care time)  Medications Ordered in ED Medications - No data to display   Initial Impression / Assessment and Plan / ED Course  I have reviewed the triage vital signs and the nursing notes.  Pertinent labs & imaging results that were available during my care of the patient were reviewed by me and considered in my medical decision making (see chart for details).     Patient with sinus pressure and congestion x 2 months.  Reports green discharge.  Will try augmentin.  Recommend PCP follow-up if no improvement.  Patient non-toxic.  In no acute distress.  Final Clinical Impressions(s) / ED Diagnoses   Final diagnoses:  Nasal congestion    ED Discharge Orders         Ordered    amoxicillin-clavulanate (AUGMENTIN) 875-125 MG tablet  Every 12 hours     09/05/18 0005           Roxy HorsemanBrowning, Symphonie Schneiderman, PA-C 09/05/18 0007    Glynn Octaveancour, Stephen, MD 09/05/18 [id number]

## 2020-01-31 ENCOUNTER — Encounter (HOSPITAL_COMMUNITY): Payer: Self-pay | Admitting: Emergency Medicine

## 2020-01-31 ENCOUNTER — Other Ambulatory Visit: Payer: Self-pay

## 2020-01-31 ENCOUNTER — Emergency Department (HOSPITAL_COMMUNITY): Payer: Self-pay

## 2020-01-31 ENCOUNTER — Emergency Department (HOSPITAL_COMMUNITY)
Admission: EM | Admit: 2020-01-31 | Discharge: 2020-01-31 | Disposition: A | Discharge status: Home / Self Care | Payer: Self-pay | Attending: Emergency Medicine | Admitting: Emergency Medicine

## 2020-01-31 DIAGNOSIS — Z79899 Other long term (current) drug therapy: Secondary | ICD-10-CM | POA: Insufficient documentation

## 2020-01-31 DIAGNOSIS — M545 Low back pain, unspecified: Secondary | ICD-10-CM

## 2020-01-31 DIAGNOSIS — F17210 Nicotine dependence, cigarettes, uncomplicated: Secondary | ICD-10-CM | POA: Insufficient documentation

## 2020-01-31 MED ORDER — NAPROXEN 250 MG PO TABS
500.0000 mg | ORAL_TABLET | Freq: Once | ORAL | Status: AC
Start: 2020-01-31 — End: 2020-01-31
  Administered 2020-01-31: 500 mg via ORAL
  Filled 2020-01-31: qty 2

## 2020-01-31 MED ORDER — LIDOCAINE 5 % EX PTCH
1.0000 | MEDICATED_PATCH | CUTANEOUS | Status: DC
  Administered 2020-01-31: 1 via TRANSDERMAL
  Filled 2020-01-31: qty 1

## 2020-01-31 MED ORDER — METHOCARBAMOL 500 MG PO TABS: 500.0000 mg | ORAL_TABLET | Freq: Two times a day (BID) | ORAL | 0 refills | Status: DC

## 2020-01-31 MED ORDER — NAPROXEN 500 MG PO TABS: 500.0000 mg | ORAL_TABLET | Freq: Two times a day (BID) | ORAL | 0 refills | Status: DC

## 2020-01-31 MED ORDER — METHOCARBAMOL 500 MG PO TABS
500.0000 mg | ORAL_TABLET | Freq: Once | ORAL | Status: AC
  Administered 2020-01-31: 500 mg via ORAL
  Filled 2020-01-31: qty 1

## 2020-01-31 MED ORDER — LIDOCAINE 5 % EX PTCH: 1.0000 | MEDICATED_PATCH | CUTANEOUS | 0 refills | Status: DC

## 2020-01-31 NOTE — ED Notes (Signed)
Patient verbalizes understanding of discharge instructions. Opportunity for questioning and answers were provided. Armband removed by staff, pt discharged from ED.  

## 2020-01-31 NOTE — ED Triage Notes (Signed)
Pt reports lower back pain x2 days after lifting a heavy object at work. Does endorse some numbness down his legs upon standing. Denies loss of bowel or bladder. Ambulatory to triage with nad.

## 2020-01-31 NOTE — Discharge Instructions (Signed)
Take the medications as prescribed.  Make sure to rest over the next few days.  Follow-up with orthopedics if your symptoms are unresolved.  Return here emergency department you develop fever, numbness to bilateral legs, bowel or bladder incontinence

## 2020-01-31 NOTE — ED Provider Notes (Signed)
[facility] EMERGENCY DEPARTMENT Provider Note   CSN: [id number] Arrival date & time: 01/31/20  0915     History Chief Complaint  Patient presents with  . Back Pain    Adam Price is a 54 y.o. male with no past  medical history who presents for evaluation of back pain.  Patient states he was at work when he picked up a box.  Felt a aching sensation to his right lower back.  States he woke up this morning pain radiates into his right gluteus.  Has had intermittent tingling sensation.  No weakness.  No IV drug use, bowel or bladder incontinence, saddle paresthesia, history of malignancy.  But without difficulty.  Took BC powders yesterday and this morning which moderately helped his pain however it quickly returned.  No fever, chills, nausea, vomiting, chest pain, shortness of breath, abdominal pain, diarrhea, dysuria.  No weakness, leg swelling, redness or warmth.  Rates his pain a 7/10.  Denies additional aggravating relieving factors  History obtained from patient past medical records.  No interpreter is used  HPI     Past Medical History:  Diagnosis Date  . Asthma   . Shortness of breath dyspnea     Patient Active Problem List   Diagnosis Date Noted  . Asthma exacerbation 08/05/2015  . Tobacco abuse   . Esophageal reflux   . Hypokalemia     Past Surgical History:  Procedure Laterality Date  . HERNIA REPAIR         No family history on file.  Social History   Tobacco Use  . Smoking status: Current Every Day Smoker  . Smokeless tobacco: Never Used  Substance Use Topics  . Alcohol use: Yes  . Drug use: No    Home Medications Prior to Admission medications   Medication Sig Start Date End Date Taking? Authorizing Provider  amoxicillin-clavulanate (AUGMENTIN) 875-125 MG tablet Take 1 tablet by mouth every 12 (twelve) hours. 09/05/18   Montine Circle, PA-C  guaiFENesin (MUCINEX) 600 MG 12 hr tablet Take 1 tablet (600 mg total) by mouth 2  (two) times daily. 08/05/15   Barton Dubois, MD  Ipratropium-Albuterol (COMBIVENT) 20-100 MCG/ACT AERS respimat Inhale 1 puff into the lungs every 6 (six) hours as needed for wheezing or shortness of breath. 08/05/15   Barton Dubois, MD  lidocaine (LIDODERM) 5 % Place 1 patch onto the skin daily. Remove & Discard patch within 12 hours or as directed by MD 01/31/20   Macallister Ashmead A, PA-C  methocarbamol (ROBAXIN) 500 MG tablet Take 1 tablet (500 mg total) by mouth 2 (two) times daily. 01/31/20   Cori Justus A, PA-C  naproxen (NAPROSYN) 500 MG tablet Take 1 tablet (500 mg total) by mouth 2 (two) times daily. 01/31/20   Moiz Ryant A, PA-C  omeprazole (PRILOSEC OTC) 20 MG tablet Take 1 tablet (20 mg total) by mouth daily. 08/05/15   Barton Dubois, MD  predniSONE (STERAPRED UNI-PAK 21 TAB) 10 MG (21) TBPK tablet Take 1 tablet (10 mg total) by mouth daily. Take 6 tabs day 1, 5 tabs day 2, 4 tabs day 3, 3 tabs day 4, 2 tabs day 5, and 1 tab on day 6. 08/04/16   Joy, Shawn C, PA-C    Allergies    Shrimp [shellfish allergy]  Review of Systems   Review of Systems  Constitutional: Negative.   HENT: Negative.   Respiratory: Negative.   Cardiovascular: Negative.   Gastrointestinal: Negative.   Genitourinary: Negative.  Musculoskeletal: Positive for back pain. Negative for gait problem, joint swelling, myalgias, neck pain and neck stiffness.  Skin: Negative.   Neurological: Negative.   All other systems reviewed and are negative.   Physical Exam Updated Vital Signs BP 132/89   Pulse (!) 103   Temp 98.4 F (36.9 C) (Oral)   Resp 16   SpO2 95%   Physical Exam Physical Exam  Constitutional: Pt appears well-developed and well-nourished. No distress.  HENT:  Head: Normocephalic and atraumatic.  Mouth/Throat: Oropharynx is clear and moist. No oropharyngeal exudate.  Eyes: Conjunctivae are normal.  Neck: Normal range of motion. Neck supple.  Full ROM without pain    Cardiovascular: Normal rate, regular rhythm and intact distal pulses.   Pulmonary/Chest: Effort normal and breath sounds normal. No respiratory distress. Pt has no wheezes.  Abdominal: Soft. Pt exhibits no distension. There is no tenderness, rebound or guarding. No abd bruit or pulsatile mass Musculoskeletal:  Full range of motion of the T-spine and L-spine with flexion, hyperextension, and lateral flexion. No midline tenderness or stepoffs. No tenderness to palpation of the spinous processes of the T-spine or L-spine. Mild tenderness to palpation of the paraspinous muscles of the L-spine.negative straight leg raise. Lymphadenopathy:    Pt has no cervical adenopathy.  Neurological: Pt is alert. Pt has normal reflexes.  Reflex Scores:      Bicep reflexes are 2+ on the right side and 2+ on the left side.      Brachioradialis reflexes are 2+ on the right side and 2+ on the left side.      Patellar reflexes are 2+ on the right side and 2+ on the left side.      Achilles reflexes are 2+ on the right side and 2+ on the left side. Speech is clear and goal oriented, follows commands Normal 5/5 strength in upper and lower extremities bilaterally including dorsiflexion and plantar flexion, strong and equal grip strength Sensation normal to light and sharp touch Moves extremities without ataxia, coordination intact Normal gait Normal balance No Clonus Skin: Skin is warm and dry. No rash noted or lesions noted. Pt is not diaphoretic. No erythema, ecchymosis,edema or warmth.  Psychiatric: Pt has a normal mood and affect. Behavior is normal.  Nursing note and vitals reviewed. ED Results / Procedures / Treatments   Labs (all labs ordered are listed, but only abnormal results are displayed) Labs Reviewed - No data to display  EKG None  Radiology DG Lumbar Spine Complete  Result Date: 01/31/2020 CLINICAL DATA:  Back pain for 2 days EXAM: LUMBAR SPINE - COMPLETE 4+ VIEW COMPARISON:  None  FINDINGS: Spinal alignment is normal. Disc space narrowing is present greatest at L4-5 and L5-S1 with facet arthropathy likely associated with neural foraminal narrowing at L4-5 and L5-S1. Mild degenerative changes are noted at the thoracolumbar junction. Incidental note is made of partially visualized bilateral hip arthroplasty changes. IMPRESSION: Degenerative changes as above.  Greatest at L4-5 and L5-S1. Electronically Signed   By: Donzetta Kohut M.D.   On: 01/31/2020 11:37    Procedures Procedures (including critical care time)  Medications Ordered in ED Medications  lidocaine (LIDODERM) 5 % 1 patch (1 patch Transdermal Patch Applied 01/31/20 1136)  naproxen (NAPROSYN) tablet 500 mg (500 mg Oral Given 01/31/20 1135)  methocarbamol (ROBAXIN) tablet 500 mg (500 mg Oral Given 01/31/20 1136)    ED Course  I have reviewed the triage vital signs and the nursing notes.  Pertinent labs & imaging results  that were available during my care of the patient were reviewed by me and considered in my medical decision making (see chart for details).  54 year old male presents for evaluation of back pain.  Occurred after lifting some boxes at work.  Does have some radicular symptoms down his right leg.  No overlying skin changes.  He is afebrile, nonseptic, non-ill-appearing.  No red flags for back pain, specifically no IV drug use, bowel or bladder incontinence, saddle paresthesia, bilateral weakness or numbness.  He is ambulatory with out difficulty.  Does have some tenderness over his right paraspinal muscles into his right piriformis.  Negative straight leg raise.  Low suspicion for cauda equina, discitis, osteomyelitis, transverse myelitis, psoas abscess.  Plain film shows some degenerative changes however no acute pathology.  Will DC home with symptomatic management.  He may follow-up with orthopedics or neurosurgery if his symptoms continue.  The patient has been appropriately medically screened and/or  stabilized in the ED. I have low suspicion for any other emergent medical condition which would require further screening, evaluation or treatment in the ED or require inpatient management.  Patient is hemodynamically stable and in no acute distress.  Patient able to ambulate in department prior to ED.  Evaluation does not show acute pathology that would require ongoing or additional emergent interventions while in the emergency department or further inpatient treatment.  I have discussed the diagnosis with the patient and answered all questions.  Pain is been managed while in the emergency department and patient has no further complaints prior to discharge.  Patient is comfortable with plan discussed in room and is stable for discharge at this time.  I have discussed strict return precautions for returning to the emergency department.  Patient was encouraged to follow-up with PCP/specialist refer to at discharge.    MDM Rules/Calculators/A&P                       Final Clinical Impression(s) / ED Diagnoses Final diagnoses:  Acute right-sided low back pain without sciatica    Rx / DC Orders ED Discharge Orders         Ordered    naproxen (NAPROSYN) 500 MG tablet  2 times daily     01/31/20 1159    lidocaine (LIDODERM) 5 %  Every 24 hours     01/31/20 1159    methocarbamol (ROBAXIN) 500 MG tablet  2 times daily     01/31/20 1159           Iesha Summerhill A, PA-C 01/31/20 1211    Little, Ambrose Finland, MD 02/01/20 1527

## 2021-11-15 ENCOUNTER — Other Ambulatory Visit: Payer: Self-pay

## 2021-11-15 ENCOUNTER — Encounter (HOSPITAL_COMMUNITY): Payer: Self-pay

## 2021-11-15 ENCOUNTER — Emergency Department (HOSPITAL_COMMUNITY): Payer: Self-pay

## 2021-11-15 ENCOUNTER — Emergency Department (HOSPITAL_COMMUNITY)
Admission: EM | Admit: 2021-11-15 | Discharge: 2021-11-16 | Disposition: A | Discharge status: Home / Self Care | Payer: Self-pay | Attending: Emergency Medicine | Admitting: Emergency Medicine

## 2021-11-15 DIAGNOSIS — R079 Chest pain, unspecified: Secondary | ICD-10-CM

## 2021-11-15 DIAGNOSIS — R202 Paresthesia of skin: Secondary | ICD-10-CM | POA: Insufficient documentation

## 2021-11-15 DIAGNOSIS — J45901 Unspecified asthma with (acute) exacerbation: Secondary | ICD-10-CM | POA: Insufficient documentation

## 2021-11-15 MED ORDER — IPRATROPIUM-ALBUTEROL 0.5-2.5 (3) MG/3ML IN SOLN
3.0000 mL | Freq: Once | RESPIRATORY_TRACT | Status: AC
  Administered 2021-11-15: 3 mL via RESPIRATORY_TRACT
  Filled 2021-11-15: qty 3

## 2021-11-15 NOTE — ED Provider Triage Note (Signed)
Emergency Medicine Provider Triage Evaluation Note  KORVER GRAYBEAL , a 56 y.o. male  was evaluated in triage.  Pt complains of chest pain.  Started earlier today when he was at work.  He reported the pain in the center of his chest.  He reports that he thinks this may be due to his asthma but then when he went to stand up quickly he felt like his heart was beating irregularly and he felt a little lightheaded but did not pass out.  He reports that he has had a cough but this is chronic and unchanged.  Uses an over-the-counter inhaler to manage his asthma but no other medications and reports he is a smoker.  Review of Systems  Positive: Chest pain, shortness of breath, cough Negative: Fever, syncope, abdominal pain  Physical Exam  BP 115/76    Pulse 99    Temp 98 F (36.7 C)    Resp 16    Ht 5\' 4"  (1.626 m)    Wt 77.1 kg    SpO2 93%    BMI 29.18 kg/m  Gen:   Awake, no distress   Resp:  Normal effort, faint expiratory wheezing noted with some decreased air movement, heart RRR, chest wall nontender MSK:   Moves extremities without difficulty  Other:    Medical Decision Making  Medically screening exam initiated at 11:13 PM.  Appropriate orders placed.  was informed that the remainder of the evaluation will be completed by another provider, this initial triage assessment does not replace that evaluation, and the importance of remaining in the ED until their evaluation is complete.  Chest pain work-up initiated, suspect asthma may be contributing, breathing treatment ordered.   Eartha Inch, Dartha Lodge 11/15/21 2320

## 2021-11-15 NOTE — ED Triage Notes (Signed)
Pt presents to the ED from home with complaints of CP earlier today while at work. Pt states he was having irregular heart beat while at work, feels more normal now but continues to have light CP.

## 2021-11-16 ENCOUNTER — Emergency Department (HOSPITAL_COMMUNITY): Payer: Self-pay

## 2021-11-16 LAB — CBC WITH DIFFERENTIAL/PLATELET
Abs Immature Granulocytes: 0.02 10*3/uL (ref 0.00–0.07)
Basophils Absolute: 0.1 10*3/uL (ref 0.0–0.1)
Basophils Relative: 1 %
Eosinophils Absolute: 0.2 10*3/uL (ref 0.0–0.5)
Eosinophils Relative: 3 %
HCT: 40.2 % (ref 39.0–52.0)
Hemoglobin: 14.1 g/dL (ref 13.0–17.0)
Immature Granulocytes: 0 %
Lymphocytes Relative: 42 %
Lymphs Abs: 2.6 10*3/uL (ref 0.7–4.0)
MCH: 36.2 pg — ABNORMAL HIGH (ref 26.0–34.0)
MCHC: 35.1 g/dL (ref 30.0–36.0)
MCV: 103.3 fL — ABNORMAL HIGH (ref 80.0–100.0)
Monocytes Absolute: 0.6 10*3/uL (ref 0.1–1.0)
Monocytes Relative: 10 %
Neutro Abs: 2.8 10*3/uL (ref 1.7–7.7)
Neutrophils Relative %: 44 %
Platelets: 307 10*3/uL (ref 150–400)
RBC: 3.89 MIL/uL — ABNORMAL LOW (ref 4.22–5.81)
RDW: 12.3 % (ref 11.5–15.5)
WBC: 6.2 10*3/uL (ref 4.0–10.5)
nRBC: 0 % (ref 0.0–0.2)

## 2021-11-16 LAB — BASIC METABOLIC PANEL
Anion gap: 15 (ref 5–15)
BUN: 9 mg/dL (ref 6–20)
CO2: 20 mmol/L — ABNORMAL LOW (ref 22–32)
Calcium: 9 mg/dL (ref 8.9–10.3)
Chloride: 102 mmol/L (ref 98–111)
Creatinine, Ser: 0.82 mg/dL (ref 0.61–1.24)
GFR, Estimated: >60 mL/min (ref >60)
Glucose, Bld: 104 mg/dL — ABNORMAL HIGH (ref 70–99)
Potassium: 3.4 mmol/L — ABNORMAL LOW (ref 3.5–5.1)
Sodium: 137 mmol/L (ref 135–145)

## 2021-11-16 LAB — TROPONIN I (HIGH SENSITIVITY)
Troponin I (High Sensitivity): 5 ng/L (ref <18)
Troponin I (High Sensitivity): 4 ng/L (ref <18)

## 2021-11-16 MED ORDER — IPRATROPIUM-ALBUTEROL 0.5-2.5 (3) MG/3ML IN SOLN
3.0000 mL | Freq: Once | RESPIRATORY_TRACT | Status: AC
  Administered 2021-11-16: 3 mL via RESPIRATORY_TRACT
  Filled 2021-11-16: qty 3

## 2021-11-16 MED ORDER — ALBUTEROL SULFATE HFA 108 (90 BASE) MCG/ACT IN AERS
2.0000 | INHALATION_SPRAY | Freq: Once | RESPIRATORY_TRACT | Status: AC
  Administered 2021-11-16: 2 via RESPIRATORY_TRACT
  Filled 2021-11-16: qty 6.7

## 2021-11-16 NOTE — Discharge Instructions (Addendum)
Your work-up today was without concerning cause of chest pain.  You are also having an asthma exacerbation for which she received multiple breathing treatments in the emergency room with some improvement in your breathing and your lung sounds.  You received an albuterol inhaler in the emergency room.  Please keep this on hand and use it as needed for shortness of breath.  He also reportedly has some tingling in your hands.  CT scan was done which did not show any concerning findings in your neck.  I will attached information for you to establish primary care provider.  Please give the clinic a call and schedule an appointment.  Have worsening chest pain, shortness of breath please return to the emergency room. ?

## 2021-11-16 NOTE — ED Provider Notes (Signed)
[facility] EMERGENCY DEPARTMENT Provider Note   CSN: [id number] Arrival date & time: 11/15/21  2152     History  Chief Complaint  Patient presents with   Chest Pain    Adam Price is a 56 y.o. male.  56 year old male presents today for evaluation of 2-week duration of substernal chest pain along with shortness of breath.  Chest pain does not radiate, is not associated with lightheadedness, palpitations, abdominal pain, nausea, or vomiting.  Also reports paresthesias in bilateral upper extremities.  Reports history of neck trauma years ago when he slipped and fell on ice.  Denies weakness in either upper extremity.  He is without fever, or chills.  He does have history of asthma and reports wheezing.  He states he has been taking an over-the-counter inhaler as needed which she got from [facility].  Does not have albuterol on hand.  No prior cardiac history.   Chest Pain Associated symptoms: shortness of breath   Associated symptoms: no abdominal pain, no fever, no nausea, no numbness, no palpitations and no vomiting       Home Medications Prior to Admission medications   Medication Sig Start Date End Date Taking? Authorizing Provider  amoxicillin-clavulanate (AUGMENTIN) 875-125 MG tablet Take 1 tablet by mouth every 12 (twelve) hours. 09/05/18   Roxy Horseman, PA-C  guaiFENesin (MUCINEX) 600 MG 12 hr tablet Take 1 tablet (600 mg total) by mouth 2 (two) times daily. 08/05/15   Vassie Loll, MD  Ipratropium-Albuterol (COMBIVENT) 20-100 MCG/ACT AERS respimat Inhale 1 puff into the lungs every 6 (six) hours as needed for wheezing or shortness of breath. 08/05/15   Vassie Loll, MD  lidocaine (LIDODERM) 5 % Place 1 patch onto the skin daily. Remove & Discard patch within 12 hours or as directed by MD 01/31/20   Henderly, Britni A, PA-C  methocarbamol (ROBAXIN) 500 MG tablet Take 1 tablet (500 mg total) by mouth 2 (two) times daily. 01/31/20   Henderly, Britni A,  PA-C  naproxen (NAPROSYN) 500 MG tablet Take 1 tablet (500 mg total) by mouth 2 (two) times daily. 01/31/20   Henderly, Britni A, PA-C  omeprazole (PRILOSEC OTC) 20 MG tablet Take 1 tablet (20 mg total) by mouth daily. 08/05/15   Vassie Loll, MD  predniSONE (STERAPRED UNI-PAK 21 TAB) 10 MG (21) TBPK tablet Take 1 tablet (10 mg total) by mouth daily. Take 6 tabs day 1, 5 tabs day 2, 4 tabs day 3, 3 tabs day 4, 2 tabs day 5, and 1 tab on day 6. 08/04/16   Joy, Shawn C, PA-C      Allergies    Shrimp [shellfish allergy]    Review of Systems   Review of Systems  Constitutional:  Negative for activity change, chills and fever.  Respiratory:  Positive for shortness of breath.   Cardiovascular:  Positive for chest pain. Negative for palpitations and leg swelling.  Gastrointestinal:  Negative for abdominal pain, nausea and vomiting.  Neurological:  Negative for numbness.  All other systems reviewed and are negative.  Physical Exam Updated Vital Signs BP (!) 135/105    Pulse 94    Temp 98.1 F (36.7 C) (Oral)    Resp 16    Ht 5\' 4"  (1.626 m)    Wt 77.1 kg    SpO2 96%    BMI 29.18 kg/m  Physical Exam  ED Results / Procedures / Treatments   Labs (all labs ordered are listed, but only abnormal results are displayed)  Labs Reviewed  BASIC METABOLIC PANEL - Abnormal; Notable for the following components:      Result Value   Potassium 3.4 (*)    CO2 20 (*)    Glucose, Bld 104 (*)    All other components within normal limits  CBC WITH DIFFERENTIAL/PLATELET - Abnormal; Notable for the following components:   RBC 3.89 (*)    MCV 103.3 (*)    MCH 36.2 (*)    All other components within normal limits  TROPONIN I (HIGH SENSITIVITY)  TROPONIN I (HIGH SENSITIVITY)    EKG EKG Interpretation  Date/Time:  Tuesday November 15 2021 22:33:39 EST Ventricular Rate:  98 PR Interval:  158 QRS Duration: 76 QT Interval:  348 QTC Calculation: 444 R Axis:   46 Text Interpretation: Normal sinus  rhythm Cannot rule out Anterior infarct , age undetermined Abnormal ECG When compared with ECG of 27-Oct-2014 12:42, PREVIOUS ECG IS PRESENT No significant change since last tracing Confirmed by Jacalyn Lefevre [phone]) on 11/16/2021 7:30:53 AM  Radiology DG Chest 2 View  Result Date: 11/15/2021 CLINICAL DATA:  Chest pain EXAM: CHEST - 2 VIEW COMPARISON:  08/04/2015 FINDINGS: Frontal and lateral views of the chest demonstrate an unremarkable cardiac silhouette. No airspace disease, effusion, or pneumothorax. No acute bony abnormalities. IMPRESSION: 1. No acute intrathoracic process. Electronically Signed   By: Sharlet Salina M.D.   On: 11/15/2021 23:42    Procedures Procedures    Medications Ordered in ED Medications  ipratropium-albuterol (DUONEB) 0.5-2.5 (3) MG/3ML nebulizer solution 3 mL (has no administration in time range)  ipratropium-albuterol (DUONEB) 0.5-2.5 (3) MG/3ML nebulizer solution 3 mL (3 mLs Nebulization Given 11/15/21 2322)    ED Course/ Medical Decision Making/ A&P                           Medical Decision Making Amount and/or Complexity of Data Reviewed Radiology: ordered.  Risk Prescription drug management.   Medical Decision Making / ED Course   This patient presents to the ED for concern of chest pain, this involves an extensive number of treatment options, and is a complaint that carries with it a high risk of complications and morbidity.  The differential diagnosis includes pneumonia, ACS, PE, asthma exacerbation, MSK injury, GERD  MDM: 56 year old male with past medical history of asthma presents today for evaluation of shortness of breath and chest pain of 2-week duration.  Patient during this timeframe also endorses wheezing and shortness of breath and has been using an over-the-counter inhaler with mild improvement.  He does not have an albuterol or other prescribed inhaler.  He received a breathing treatment prior to being roomed with some improvement.  No  prior cardiac history.  BMP with potassium 3.4 CO2 of 20 otherwise without acute findings.  CBC without leukocytosis, anemia.  Troponin negative x2.  EKG without acute ischemic findings.  Chest x-ray without acute cardiopulm process.  Will provide additional DuoNeb treatment for diffuse wheezing.  Patient also reports bilateral hand paresthesias that have been ongoing for couple months.  Without weakness.  Will evaluate with CT cervical spine.  Reports previous fall years ago.  Bilateral upper extremity strength 5/5.  Sensation intact.  2+ radial pulse present. Patient reports improvement after second DuoNeb treatment.  Reports improvement in chest pain.  CT cervical spine with degenerative changes but otherwise without acute findings.  Patient does not have a primary care provider.  We will provide resources to establish 1.  Return  precautions discussed.  Patient voices understanding and is in agreement with plan.   Lab Tests: -I ordered, reviewed, and interpreted labs.   The pertinent results include:   Labs Reviewed  BASIC METABOLIC PANEL - Abnormal; Notable for the following components:      Result Value   Potassium 3.4 (*)    CO2 20 (*)    Glucose, Bld 104 (*)    All other components within normal limits  CBC WITH DIFFERENTIAL/PLATELET - Abnormal; Notable for the following components:   RBC 3.89 (*)    MCV 103.3 (*)    MCH 36.2 (*)    All other components within normal limits  TROPONIN I (HIGH SENSITIVITY)  TROPONIN I (HIGH SENSITIVITY)      EKG  EKG Interpretation  Date/Time:  Tuesday November 15 2021 22:33:39 EST Ventricular Rate:  98 PR Interval:  158 QRS Duration: 76 QT Interval:  348 QTC Calculation: 444 R Axis:   46 Text Interpretation: Normal sinus rhythm Cannot rule out Anterior infarct , age undetermined Abnormal ECG When compared with ECG of 27-Oct-2014 12:42, PREVIOUS ECG IS PRESENT No significant change since last tracing Confirmed by Jacalyn Lefevre [phone]) on  11/16/2021 7:30:53 AM         Imaging Studies ordered: I ordered imaging studies including chest x-ray, CT cervical spine without contrast I independently visualized and interpreted imaging. I agree with the radiologist interpretation   Medicines ordered and prescription drug management: Meds ordered this encounter  Medications   ipratropium-albuterol (DUONEB) 0.5-2.5 (3) MG/3ML nebulizer solution 3 mL   ipratropium-albuterol (DUONEB) 0.5-2.5 (3) MG/3ML nebulizer solution 3 mL   albuterol (VENTOLIN HFA) 108 (90 Base) MCG/ACT inhaler 2 puff    -I have reviewed the patients home medicines and have made adjustments as needed  Cardiac Monitoring: The patient was maintained on a cardiac monitor.  I personally viewed and interpreted the cardiac monitored which showed an underlying rhythm of: NSR  Social Determinants of Health:  Factors impacting patients care include: Does not have PCP.  Resources provided to establish a PCP.   Reevaluation: After the interventions noted above, I reevaluated the patient and found that they have :improved  Co morbidities that complicate the patient evaluation  Past Medical History:  Diagnosis Date   Asthma    Shortness of breath dyspnea       Dispostion: Patient is appropriate for discharge.  Discharged in stable condition.  Return precautions discussed.     Final Clinical Impression(s) / ED Diagnoses Final diagnoses:  Exacerbation of asthma, unspecified asthma severity, unspecified whether persistent  Paresthesias  Chest pain, unspecified type    Rx / DC Orders ED Discharge Orders     None         Marita Kansas, PA-C 11/16/21 0840    Jacalyn Lefevre, MD 11/16/21 1051

## 2021-11-30 ENCOUNTER — Ambulatory Visit: Payer: Self-pay | Attending: Critical Care Medicine | Admitting: Critical Care Medicine

## 2021-11-30 ENCOUNTER — Other Ambulatory Visit: Payer: Self-pay

## 2021-11-30 ENCOUNTER — Encounter: Payer: Self-pay | Admitting: Critical Care Medicine

## 2021-11-30 VITALS — BP 135/84 | HR 90 | Wt 143.6 lb

## 2021-11-30 DIAGNOSIS — Z1159 Encounter for screening for other viral diseases: Secondary | ICD-10-CM

## 2021-11-30 DIAGNOSIS — Z139 Encounter for screening, unspecified: Secondary | ICD-10-CM

## 2021-11-30 DIAGNOSIS — Z720 Tobacco use: Secondary | ICD-10-CM

## 2021-11-30 DIAGNOSIS — Z789 Other specified health status: Secondary | ICD-10-CM

## 2021-11-30 DIAGNOSIS — J4541 Moderate persistent asthma with (acute) exacerbation: Secondary | ICD-10-CM

## 2021-11-30 DIAGNOSIS — K219 Gastro-esophageal reflux disease without esophagitis: Secondary | ICD-10-CM

## 2021-11-30 DIAGNOSIS — Z1211 Encounter for screening for malignant neoplasm of colon: Secondary | ICD-10-CM

## 2021-11-30 DIAGNOSIS — Z114 Encounter for screening for human immunodeficiency virus [HIV]: Secondary | ICD-10-CM

## 2021-11-30 MED ORDER — ADVAIR HFA 115-21 MCG/ACT IN AERO: 2.0000 | INHALATION_SPRAY | Freq: Two times a day (BID) | RESPIRATORY_TRACT | 12 refills | Status: DC

## 2021-11-30 MED ORDER — ADVAIR HFA 115-21 MCG/ACT IN AERO
2.0000 | INHALATION_SPRAY | Freq: Two times a day (BID) | RESPIRATORY_TRACT | 12 refills | Status: AC
  Filled 2021-11-30: qty 12, 30d supply, fill #0

## 2021-11-30 MED ORDER — AZELASTINE HCL 0.1 % NA SOLN: 2.0000 | Freq: Two times a day (BID) | NASAL | 12 refills | Status: DC

## 2021-11-30 MED ORDER — AZELASTINE HCL 0.1 % NA SOLN
2.0000 | Freq: Two times a day (BID) | NASAL | 12 refills | Status: AC
  Filled 2021-11-30: qty 30, 30d supply, fill #0

## 2021-11-30 MED ORDER — ALBUTEROL SULFATE HFA 108 (90 BASE) MCG/ACT IN AERS: 2.0000 | INHALATION_SPRAY | Freq: Four times a day (QID) | RESPIRATORY_TRACT | 1 refills | Status: AC | PRN

## 2021-11-30 NOTE — Assessment & Plan Note (Signed)
Recommended patient reduce his alcohol content intake to about 1 or 2 beers a day from the sixpack a day currently ?

## 2021-11-30 NOTE — Patient Instructions (Signed)
Start Advair 2 elations twice daily ? ?Start Astelin 2 sprays both nostrils twice daily ? ?Tetanus shot was given ? ?Complete set of health screening labs obtained today ? ?Focus on reducing tobacco intake and alcohol intake as we discussed ? ?See lifestyle management handout for your diet ? ?Return to see Dr. Delford Field 4 months ? ? ?

## 2021-11-30 NOTE — Progress Notes (Signed)
? ?New Patient Office Visit ? ?Subjective:  ?Patient ID: Adam Price, male    DOB: Feb 14, 1966  Age: 56 y.o. MRN: [id number] ? ?CC:  ?Chief Complaint  ?Patient presents with  ? Hospitalization Follow-up  ? ? ?HPI ?Adam Price presents for primary care to establish.  Patient was in the emergency room on 28 February below is documentation from that visit. ?11/15/21  ?ED: ?56 year old male with past medical history of asthma presents today for evaluation of shortness of breath and chest pain of 2-week duration.  Patient during this timeframe also endorses wheezing and shortness of breath and has been using an over-the-counter inhaler with mild improvement.  He does not have an albuterol or other prescribed inhaler.  He received a breathing treatment prior to being roomed with some improvement.  No prior cardiac history.  BMP with potassium 3.4 CO2 of 20 otherwise without acute findings.  CBC without leukocytosis, anemia.  Troponin negative x2.  EKG without acute ischemic findings.  Chest x-ray without acute cardiopulm process.  Will provide additional DuoNeb treatment for diffuse wheezing.  Patient also reports bilateral hand paresthesias that have been ongoing for couple months.  Without weakness.  Will evaluate with CT cervical spine.  Reports previous fall years ago.  Bilateral upper extremity strength 5/5.  Sensation intact.  2+ radial pulse present. ?Patient reports improvement after second DuoNeb treatment.  Reports improvement in chest pain.  CT cervical spine with degenerative changes but otherwise without acute findings.  Patient does not have a primary care provider.  We will provide resources to establish 1.  Return precautions discussed.  Patient voices understanding and is in agreement  ? ?Patient using albuterol only at this time states he still having wheezing still coughing up clear mucus has some right ear congestion.  He smokes 2 to 3 cigarettes a day.  He is drinking about sixpack of beer  daily. ?Patient is short of breath with some exertion.  He needs a tetanus shot agrees to receive this.  He also needs colon cancer screening and agrees to receive this as well.  Patient states he does live in a home with poor foundation that water stands underneath and he also has mold in his bathrooms ?Past Medical History:  ?Diagnosis Date  ? Asthma   ? Esophageal reflux   ? Shortness of breath dyspnea   ? ? ?Past Surgical History:  ?Procedure Laterality Date  ? HERNIA REPAIR    ? ? ?History reviewed. No pertinent family history. ? ?Social History  ? ?Socioeconomic History  ? Marital status: Legally Separated  ?  Spouse name: Not on file  ? Number of children: Not on file  ? Years of education: Not on file  ? Highest education level: Not on file  ?Occupational History  ? Not on file  ?Tobacco Use  ? Smoking status: Every Day  ?  Packs/day: 0.50  ?  Types: Cigarettes  ? Smokeless tobacco: Never  ?Substance and Sexual Activity  ? Alcohol use: Yes  ?  Comment: occasionally  ? Drug use: No  ? Sexual activity: Not on file  ?Other Topics Concern  ? Not on file  ?Social History Narrative  ? Not on file  ? ?Social Determinants of Health  ? ?Financial Resource Strain: Not on file  ?Food Insecurity: Not on file  ?Transportation Needs: Not on file  ?Physical Activity: Not on file  ?Stress: Not on file  ?Social Connections: Not on file  ?Intimate Partner Violence: Not  on file  ? ? ?ROS ?Review of Systems  ?Constitutional: Negative.  Negative for chills, diaphoresis and fever.  ?HENT:  Positive for postnasal drip, rhinorrhea, sinus pressure and sinus pain. Negative for congestion, ear discharge, ear pain, hearing loss, nosebleeds, sneezing, sore throat, tinnitus, trouble swallowing and voice change.   ?Eyes:  Positive for photophobia. Negative for redness.  ?Respiratory:  Positive for cough (green) and wheezing. Negative for apnea, choking, chest tightness, shortness of breath and stridor.   ?Cardiovascular: Negative.   Negative for chest pain, palpitations and leg swelling.  ?Gastrointestinal: Negative.  Negative for abdominal distention, abdominal pain, blood in stool, constipation, diarrhea, nausea and vomiting.  ?Endocrine: Negative for polydipsia.  ?Genitourinary: Negative.  Negative for dysuria, flank pain, frequency, hematuria and urgency.  ?Musculoskeletal: Negative.  Negative for arthralgias, back pain, myalgias and neck pain.  ?Skin: Negative.  Negative for rash.  ?Allergic/Immunologic: Negative.  Negative for environmental allergies and food allergies.  ?Neurological: Negative.  Negative for dizziness, tremors, seizures, syncope, weakness and headaches.  ?Hematological: Negative.  Negative for adenopathy. Does not bruise/bleed easily.  ?Psychiatric/Behavioral: Negative.  Negative for agitation, decreased concentration, dysphoric mood, hallucinations, self-injury, sleep disturbance and suicidal ideas. The patient is not nervous/anxious and is not hyperactive.   ? ?Objective:  ? ?Today's Vitals: BP 135/84   Pulse 90   Wt 143 lb 9.6 oz (65.1 kg)   SpO2 97%   BMI 24.65 kg/m?  ? ?Physical Exam ?Vitals reviewed.  ?Constitutional:   ?   Appearance: Normal appearance. He is well-developed. He is not diaphoretic.  ?HENT:  ?   Head: Normocephalic and atraumatic.  ?   Nose: Congestion and rhinorrhea present. No nasal deformity, septal deviation or mucosal edema.  ?   Right Sinus: No maxillary sinus tenderness or frontal sinus tenderness.  ?   Left Sinus: No maxillary sinus tenderness or frontal sinus tenderness.  ?   Mouth/Throat:  ?   Mouth: Mucous membranes are moist.  ?   Pharynx: Oropharynx is clear. No oropharyngeal exudate.  ?Eyes:  ?   General: No scleral icterus. ?   Conjunctiva/sclera: Conjunctivae normal.  ?   Pupils: Pupils are equal, round, and reactive to light.  ?Neck:  ?   Thyroid: No thyromegaly.  ?   Vascular: No carotid bruit or JVD.  ?   Trachea: Trachea normal. No tracheal tenderness or tracheal deviation.   ?Cardiovascular:  ?   Rate and Rhythm: Normal rate and regular rhythm.  ?   Chest Wall: PMI is not displaced.  ?   Pulses: Normal pulses. No decreased pulses.  ?   Heart sounds: Normal heart sounds, S1 normal and S2 normal. Heart sounds not distant. No murmur heard. ?No systolic murmur is present.  ?No diastolic murmur is present.  ?  No friction rub. No gallop. No S3 or S4 sounds.  ?Pulmonary:  ?   Effort: Pulmonary effort is normal. No tachypnea, accessory muscle usage or respiratory distress.  ?   Breath sounds: No stridor. Wheezing present. No decreased breath sounds, rhonchi or rales.  ?   Comments: Distant breath sounds ?Chest:  ?   Chest wall: No tenderness.  ?Abdominal:  ?   General: Bowel sounds are normal. There is no distension.  ?   Palpations: Abdomen is soft. Abdomen is not rigid.  ?   Tenderness: There is no abdominal tenderness. There is no guarding or rebound.  ?Musculoskeletal:     ?   General: Normal range of motion.  ?  Cervical back: Normal range of motion and neck supple. No edema, erythema or rigidity. No muscular tenderness. Normal range of motion.  ?Lymphadenopathy:  ?   Head:  ?   Right side of head: No submental or submandibular adenopathy.  ?   Left side of head: No submental or submandibular adenopathy.  ?   Cervical: No cervical adenopathy.  ?Skin: ?   General: Skin is warm and dry.  ?   Coloration: Skin is not pale.  ?   Findings: No rash.  ?   Nails: There is no clubbing.  ?Neurological:  ?   Mental Status: He is alert and oriented to person, place, and time.  ?   Sensory: No sensory deficit.  ?Psychiatric:     ?   Speech: Speech normal.     ?   Behavior: Behavior normal.  ? ? ?Assessment & Plan:  ? ?Problem List Items Addressed This Visit   ? ?  ? Respiratory  ? Asthma exacerbation - Primary  ?  History of reactive airways disease likely mold is a trigger as he has mold in the home substandard housing is noted ? ?Plan to begin Advair 115 mcg strength 2 elations twice daily patient  instructed as to proper HFA use ? ?Plan to begin albuterol as needed ? ?No antibiotics or systemic prednisone needed ? ?Patient return in short-term follow-up ?  ?  ? Relevant Medications  ? albuterol (VENTOLIN HFA)

## 2021-11-30 NOTE — Assessment & Plan Note (Signed)
History of reactive airways disease likely mold is a trigger as he has mold in the home substandard housing is noted ? ?Plan to begin Advair 115 mcg strength 2 elations twice daily patient instructed as to proper HFA use ? ?Plan to begin albuterol as needed ? ?No antibiotics or systemic prednisone needed ? ?Patient return in short-term follow-up ?

## 2021-11-30 NOTE — Assessment & Plan Note (Signed)
We will complete full health screening labs ?

## 2021-11-30 NOTE — Assessment & Plan Note (Signed)
No active reflux at this time we will monitor ?

## 2021-11-30 NOTE — Progress Notes (Signed)
Pt states to having knot in center of chest, states it doesn't hurt unless it is touched/pressed on  ?

## 2021-11-30 NOTE — Assessment & Plan Note (Signed)
? ? ??   Current smoking consumption amount: 3 cigarettes a day ? ?? Dicsussion on advise to quit smoking and smoking impacts: Lung impacts ? ?? Patient's willingness to quit: Wants to quit ? ?? Methods to quit smoking discussed: Behavioral modification ? ?? Medication management of smoking session drugs discussed: Not discussed ? ?? Resources provided:  AVS  ? ?? Setting quit date not established ? ?? Follow-up arranged 2 months ? ? ?Time spent counseling the patient: 5 minutes ? ? ?

## 2021-12-01 ENCOUNTER — Other Ambulatory Visit: Payer: Self-pay

## 2021-12-01 LAB — CBC WITH DIFFERENTIAL/PLATELET
Basophils Absolute: 0.1 10*3/uL (ref 0.0–0.2)
Basos: 1 %
EOS (ABSOLUTE): 0.1 10*3/uL (ref 0.0–0.4)
Eos: 2 %
Hematocrit: 42 % (ref 37.5–51.0)
Hemoglobin: 15 g/dL (ref 13.0–17.7)
Immature Grans (Abs): 0 10*3/uL (ref 0.0–0.1)
Immature Granulocytes: 0 %
Lymphocytes Absolute: 1.6 10*3/uL (ref 0.7–3.1)
Lymphs: 30 %
MCH: 35.5 pg — ABNORMAL HIGH (ref 26.6–33.0)
MCHC: 35.7 g/dL (ref 31.5–35.7)
MCV: 99 fL — ABNORMAL HIGH (ref 79–97)
Monocytes Absolute: 0.4 10*3/uL (ref 0.1–0.9)
Monocytes: 8 %
Neutrophils Absolute: 3.3 10*3/uL (ref 1.4–7.0)
Neutrophils: 59 %
Platelets: 302 10*3/uL (ref 150–450)
RBC: 4.23 x10E6/uL (ref 4.14–5.80)
RDW: 11.5 % — ABNORMAL LOW (ref 11.6–15.4)
WBC: 5.5 10*3/uL (ref 3.4–10.8)

## 2021-12-01 LAB — COMPREHENSIVE METABOLIC PANEL
ALT: 21 IU/L (ref 0–44)
AST: 51 IU/L — ABNORMAL HIGH (ref 0–40)
Albumin/Globulin Ratio: 1.7 (ref 1.2–2.2)
Albumin: 4.9 g/dL (ref 3.8–4.9)
Alkaline Phosphatase: 158 IU/L — ABNORMAL HIGH (ref 44–121)
BUN/Creatinine Ratio: 12 (ref 9–20)
BUN: 10 mg/dL (ref 6–24)
Bilirubin Total: 0.7 mg/dL (ref 0.0–1.2)
CO2: 26 mmol/L (ref 20–29)
Calcium: 10.2 mg/dL (ref 8.7–10.2)
Chloride: 99 mmol/L (ref 96–106)
Creatinine, Ser: 0.86 mg/dL (ref 0.76–1.27)
Globulin, Total: 2.9 g/dL (ref 1.5–4.5)
Glucose: 103 mg/dL — ABNORMAL HIGH (ref 70–99)
Potassium: 5.4 mmol/L — ABNORMAL HIGH (ref 3.5–5.2)
Sodium: 138 mmol/L (ref 134–144)
Total Protein: 7.8 g/dL (ref 6.0–8.5)
eGFR: 103 mL/min/{1.73_m2} (ref >59)

## 2021-12-01 LAB — LIPID PANEL
Chol/HDL Ratio: 2.1 ratio (ref 0.0–5.0)
Cholesterol, Total: 192 mg/dL (ref 100–199)
HDL: 92 mg/dL (ref >39)
LDL Chol Calc (NIH): 85 mg/dL (ref 0–99)
Triglycerides: 84 mg/dL (ref 0–149)
VLDL Cholesterol Cal: 15 mg/dL (ref 5–40)

## 2021-12-01 LAB — HEMOGLOBIN A1C
Est. average glucose Bld gHb Est-mCnc: 97 mg/dL
Hgb A1c MFr Bld: 5 % (ref 4.8–5.6)

## 2021-12-01 LAB — HCV INTERPRETATION

## 2021-12-01 LAB — HCV AB W REFLEX TO QUANT PCR: HCV Ab: NONREACTIVE

## 2021-12-01 LAB — HIV ANTIBODY (ROUTINE TESTING W REFLEX): HIV Screen 4th Generation wRfx: NONREACTIVE

## 2021-12-02 ENCOUNTER — Telehealth: Payer: Self-pay

## 2021-12-02 NOTE — Telephone Encounter (Signed)
Called pt for labs results but number was not correct ?

## 2021-12-08 ENCOUNTER — Other Ambulatory Visit: Payer: Self-pay

## 2022-05-06 IMAGING — CT CT CERVICAL SPINE W/O CM
3 of 4 series · 13 of 33 positions shown, 16 images · non-contrast
Comparison: CT cervical spine dated December 06, 2011.

CLINICAL DATA: Acute on chronic neck pain.



[Series 5: c_spine 2.0 st · axial · 0.25mm/px · z∈[-196,-78]mm · 5 of 89 slices shown, 7 images]
[im 15/89  soft-tissue]
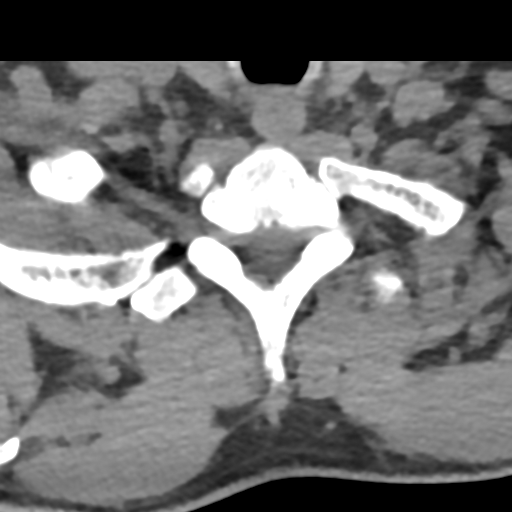
[im 15/89  bone]
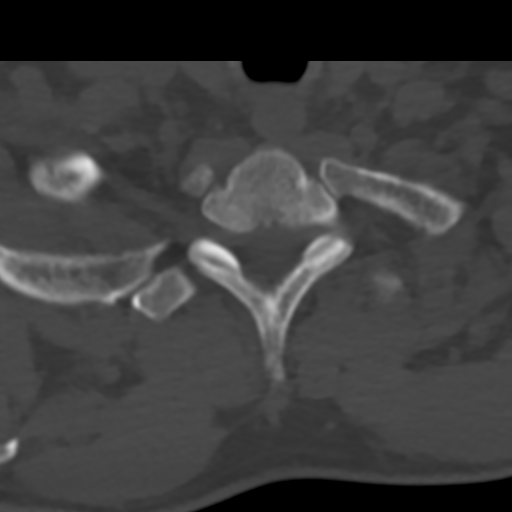
[im 30/89  bone]
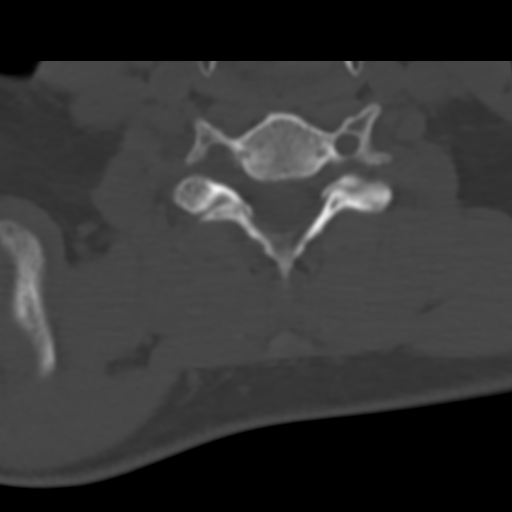
[im 45/89  bone]
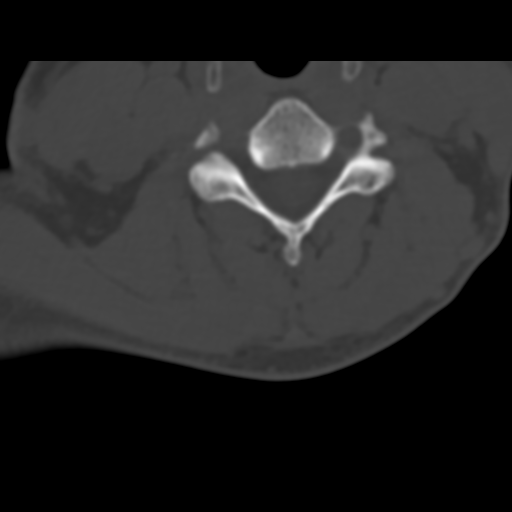
[im 59/89  bone]
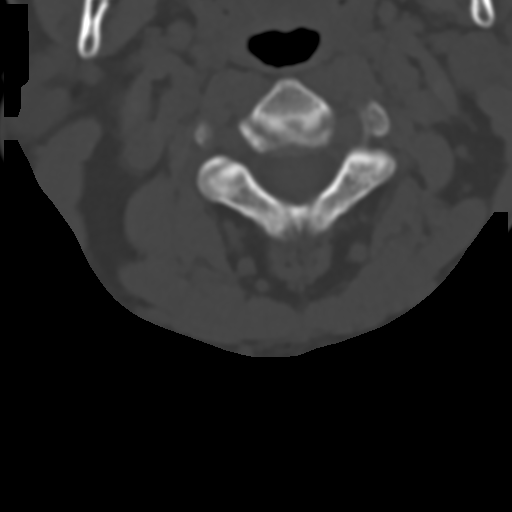
[im 74/89  soft-tissue]
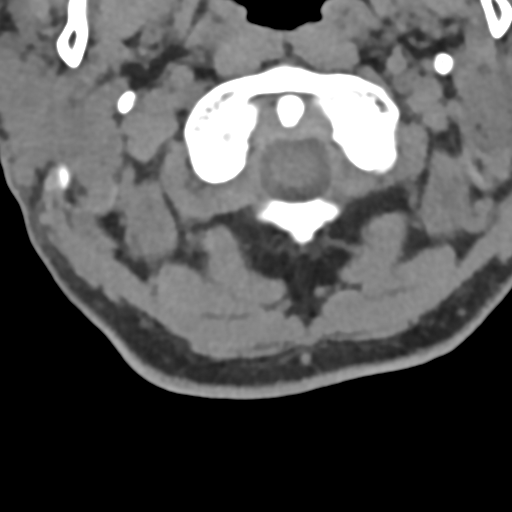
[im 74/89  bone]
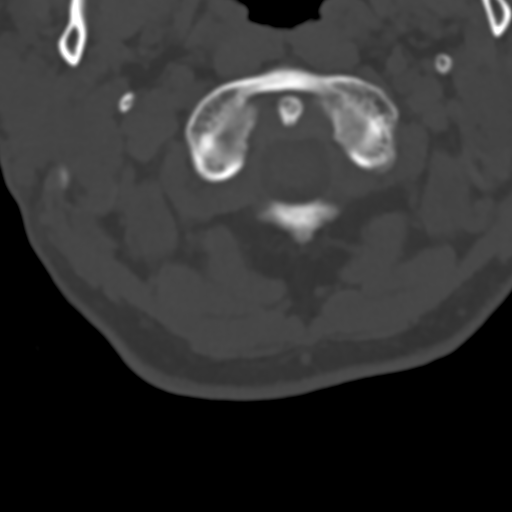

[Series 6: coronal bone · coronal · 0.26mm/px · 3 of 61 slices shown]
[im 13/61  bone]
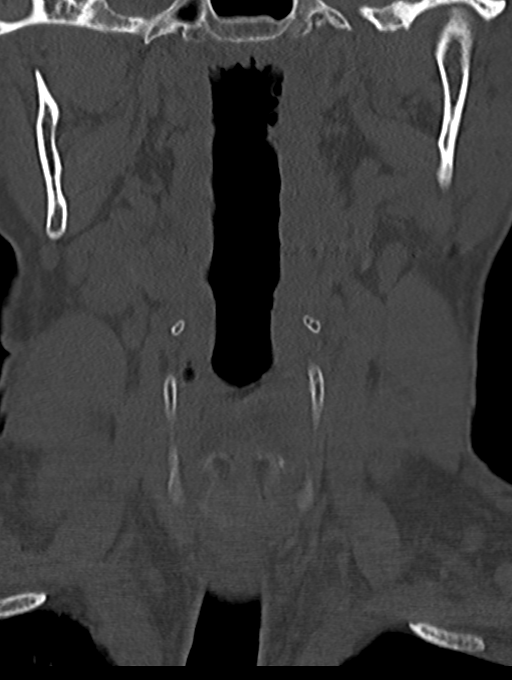
[im 25/61  bone]
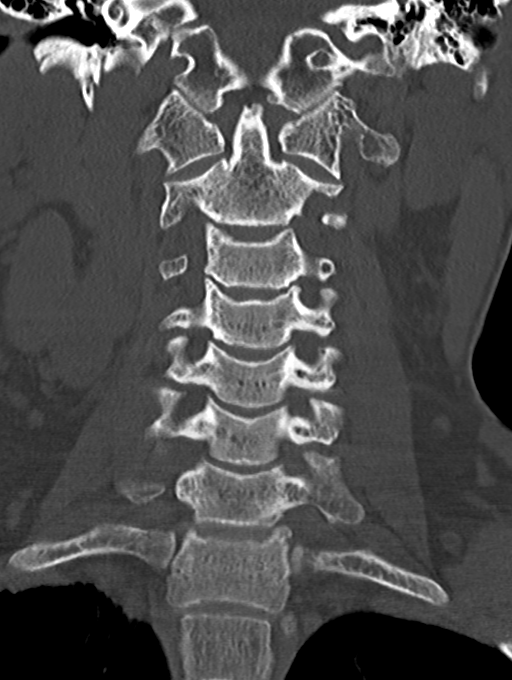
[im 37/61  bone]
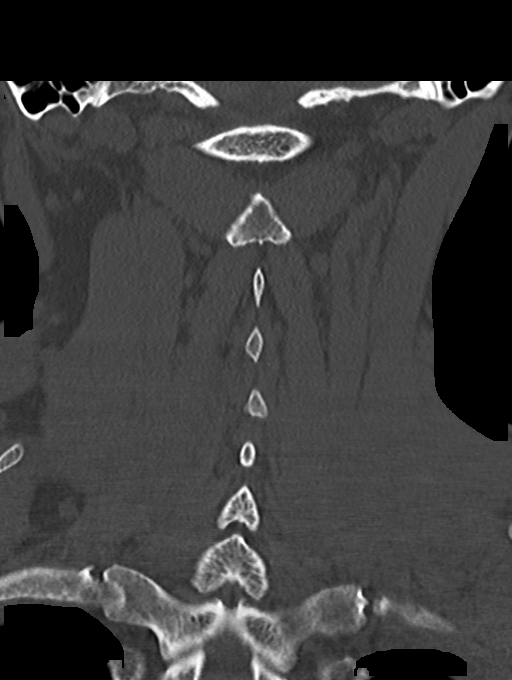

[Series 7: sagittal bone · sagittal · 0.26mm/px · 5 of 61 slices shown, 6 images]
[im 21/61  bone]
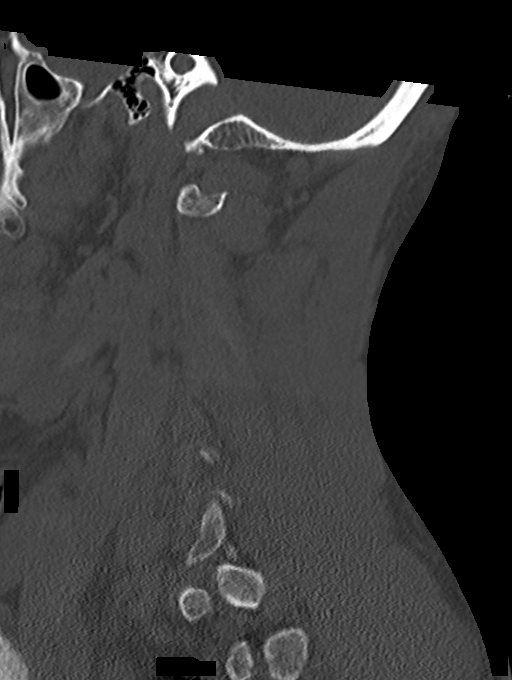
[im 26/61  bone]
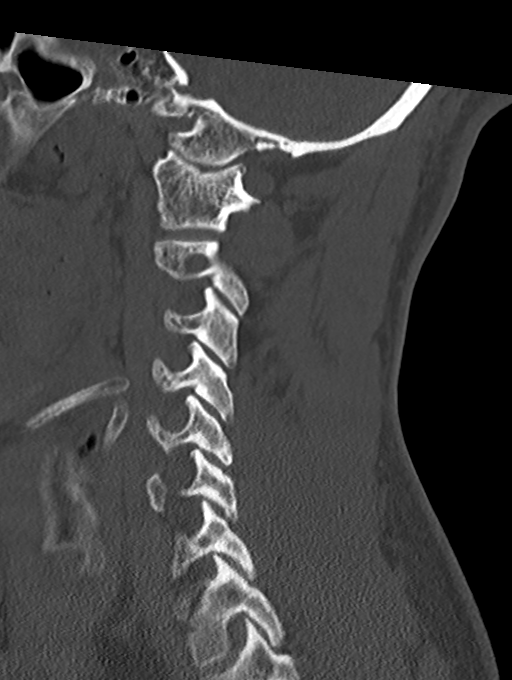
[im 31/61  soft-tissue]
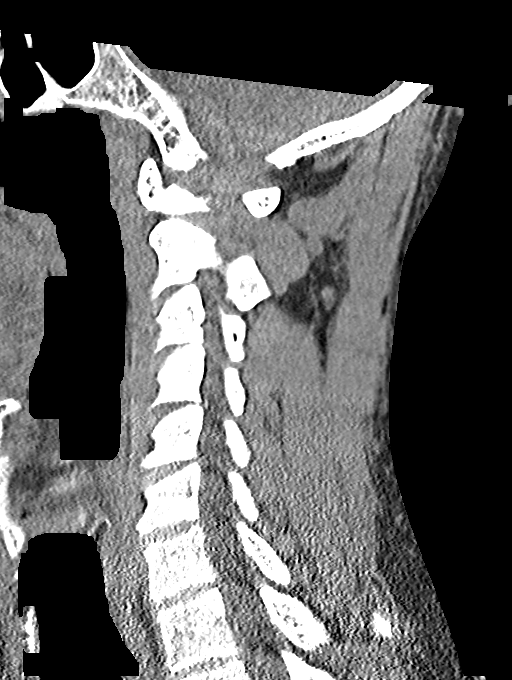
[im 31/61  bone]
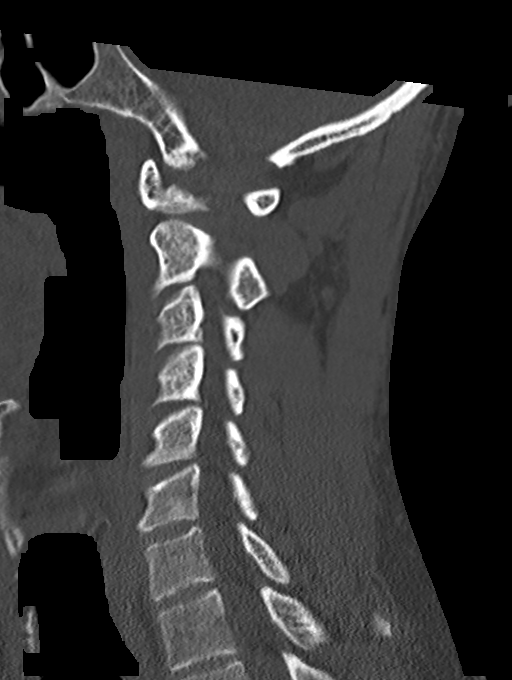
[im 36/61  bone]
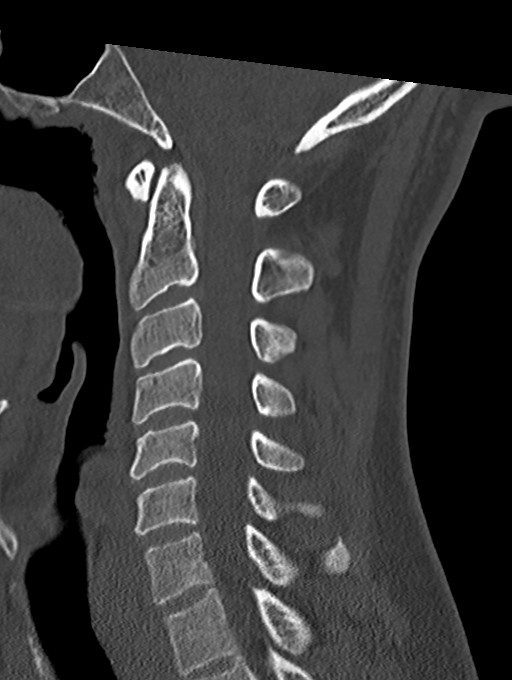
[im 41/61  bone]
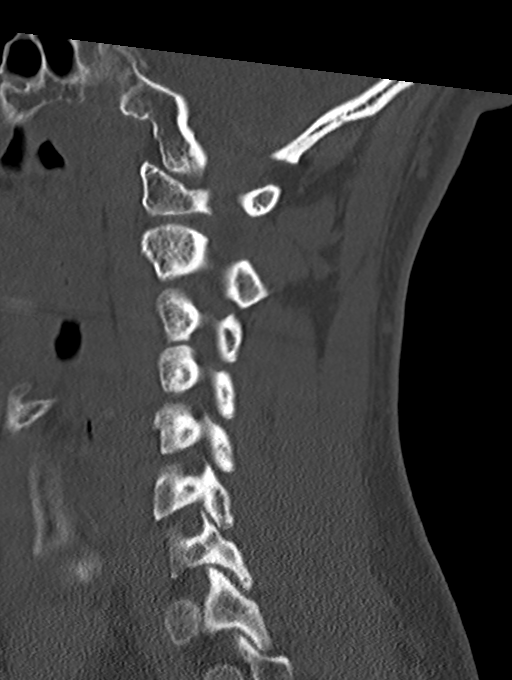

[13 of 33 positions shown; findings below may reference images not displayed]

FINDINGS: Alignment: Mild straightening of the normal cervical lordosis. No
listhesis.

Skull base and vertebrae: No acute fracture. No primary bone lesion
or focal pathologic process.

Soft tissues and spinal canal: No prevertebral fluid or swelling. No
visible canal hematoma.

Disc levels:

C2-C3: Small shallow broad-based posterior disc protrusion. No
stenosis.

C3-C4: Negative disc. Mild bilateral uncovertebral hypertrophy. Mild
bilateral neuroforaminal stenosis. No spinal canal stenosis.

C4-C5: Small central disc protrusion. Mild right uncovertebral
hypertrophy. No stenosis.

C5-C6: Small central disc protrusion. Mild right uncovertebral
hypertrophy. No stenosis.

C6-C7: Small broad-based posterior disc protrusion. Mild right
uncovertebral hypertrophy. No stenosis.

C7-T1: Negative disc. Mild bilateral uncovertebral hypertrophy. No
stenosis.

Upper chest: Mild paraseptal emphysema at the right lung apex.

Other: None.
IMPRESSION: 1. Mild degenerative changes in the cervical spine as described
above. No high-grade spinal canal or neuroforaminal stenosis.
2. Emphysema ([id number]).
